# Patient Record
Sex: Female | Born: 1995 | Race: Black or African American | Hispanic: No | Marital: Single | Smoking: Never smoker
Health system: Southern US, Community
[De-identification: ages and names within clinical notes are randomized; demographics above are authoritative.]

## PROBLEM LIST (undated history)

## (undated) ENCOUNTER — Inpatient Hospital Stay (HOSPITAL_COMMUNITY): Payer: Self-pay

## (undated) DIAGNOSIS — Z789 Other specified health status: Secondary | ICD-10-CM

## (undated) HISTORY — PX: NO PAST SURGERIES: SHX2092

---
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
## 2015-05-29 ENCOUNTER — Encounter (HOSPITAL_COMMUNITY): Payer: Self-pay | Admitting: *Deleted

## 2015-05-29 ENCOUNTER — Emergency Department (HOSPITAL_COMMUNITY)
Admission: EM | Admit: 2015-05-29 | Discharge: 2015-05-29 | Disposition: A | Discharge status: Home / Self Care | Payer: Medicaid Other | Attending: Emergency Medicine | Admitting: Emergency Medicine

## 2015-05-29 DIAGNOSIS — K219 Gastro-esophageal reflux disease without esophagitis: Secondary | ICD-10-CM | POA: Insufficient documentation

## 2015-05-29 DIAGNOSIS — Z79899 Other long term (current) drug therapy: Secondary | ICD-10-CM | POA: Insufficient documentation

## 2015-05-29 DIAGNOSIS — Z3202 Encounter for pregnancy test, result negative: Secondary | ICD-10-CM | POA: Insufficient documentation

## 2015-05-29 LAB — COMPREHENSIVE METABOLIC PANEL
ALBUMIN: 3.6 g/dL (ref 3.5–5.0)
ALK PHOS: 57 U/L (ref 38–126)
ALT: 22 U/L (ref 14–54)
AST: 22 U/L (ref 15–41)
Anion gap: 7 (ref 5–15)
BUN: 8 mg/dL (ref 6–20)
CALCIUM: 9 mg/dL (ref 8.9–10.3)
CHLORIDE: 106 mmol/L (ref 101–111)
CO2: 26 mmol/L (ref 22–32)
CREATININE: 0.7 mg/dL (ref 0.44–1.00)
GFR calc Af Amer: >60 mL/min (ref >60)
GFR calc non Af Amer: >60 mL/min (ref >60)
GLUCOSE: 90 mg/dL (ref 65–99)
Potassium: 3.8 mmol/L (ref 3.5–5.1)
SODIUM: 139 mmol/L (ref 135–145)
Total Bilirubin: 0.3 mg/dL (ref 0.3–1.2)
Total Protein: 7.1 g/dL (ref 6.5–8.1)

## 2015-05-29 LAB — URINALYSIS, ROUTINE W REFLEX MICROSCOPIC
Glucose, UA: NEGATIVE mg/dL
Hgb urine dipstick: NEGATIVE
Ketones, ur: NEGATIVE mg/dL
NITRITE: NEGATIVE
PH: 5.5 (ref 5.0–8.0)
Protein, ur: NEGATIVE mg/dL
SPECIFIC GRAVITY, URINE: 1.029 (ref 1.005–1.030)
Urobilinogen, UA: 1 mg/dL (ref 0.0–1.0)

## 2015-05-29 LAB — I-STAT BETA HCG BLOOD, ED (MC, WL, AP ONLY)

## 2015-05-29 LAB — CBC
HCT: 36.3 % (ref 36.0–46.0)
HEMOGLOBIN: 12.3 g/dL (ref 12.0–15.0)
MCH: 28.9 pg (ref 26.0–34.0)
MCHC: 33.9 g/dL (ref 30.0–36.0)
MCV: 85.4 fL (ref 78.0–100.0)
Platelets: 302 10*3/uL (ref 150–400)
RBC: 4.25 MIL/uL (ref 3.87–5.11)
RDW: 12.8 % (ref 11.5–15.5)
WBC: 6.5 10*3/uL (ref 4.0–10.5)

## 2015-05-29 LAB — URINE MICROSCOPIC-ADD ON

## 2015-05-29 MED ORDER — FAMOTIDINE 20 MG PO TABS: 20.0000 mg | ORAL_TABLET | Freq: Two times a day (BID) | ORAL | Status: DC

## 2015-05-29 MED ORDER — GI COCKTAIL ~~LOC~~
30.0000 mL | Freq: Once | ORAL | Status: DC
  Filled 2015-05-29: qty 30

## 2015-05-29 MED ORDER — FAMOTIDINE 20 MG PO TABS
40.0000 mg | ORAL_TABLET | Freq: Once | ORAL | Status: AC
  Administered 2015-05-29: 40 mg via ORAL
  Filled 2015-05-29: qty 2

## 2015-05-29 NOTE — ED Notes (Signed)
Patient reports mid-upper abdominal pain that radiates into chest since yesterday, no associated symptoms. Described as cramping. States pain will go away for a few minutes then comes back.

## 2015-05-29 NOTE — ED Notes (Signed)
Pt remains monitored by blood pressure and pulse ox. Pts family remains at bedside.  

## 2015-05-29 NOTE — ED Provider Notes (Signed)
CSN: [id number]     Arrival date & time 05/29/15  1327 History   First MD Initiated Contact with Patient 05/29/15 1709     Chief Complaint  Patient presents with  . Abdominal Pain     (Consider location/radiation/quality/duration/timing/severity/associated sxs/prior Treatment) HPI   Heather Paul is a 19 y.o. female, pt with no pertinent past medical history, complains of sharp/cramping pain that is intermittent in the epigastric region beginning yesterday. Rates it 10/10 when it comes on, moves upward to chest and throat. Pt has not tried anything for the pain. Denies N/V/C/D, fever/chills, changes with eating, diaphoresis, or any other complaints. Pt denies urinary symptoms. No increase in pain upon tightening abdominal muscles. Pain comes on more frequently and is worse when she is laying down.   History reviewed. No pertinent past medical history. Past Surgical History  Procedure Laterality Date  . No past surgeries     Family History  Problem Relation Age of Onset  . Hypertension Mother   . Diabetes Mother     boderline  . Stroke Maternal Grandmother    Social History  Substance Use Topics  . Smoking status: Never Smoker   . Smokeless tobacco: Never Used  . Alcohol Use: No   OB History    Gravida Para Term Preterm AB TAB SAB Ectopic Multiple Living       Review of Systems  Constitutional: Negative for fever, chills, diaphoresis and unexpected weight change.  Respiratory: Negative for cough, chest tightness and shortness of breath.   Cardiovascular: Negative for chest pain, palpitations and leg swelling.  Gastrointestinal: Positive for abdominal pain. Negative for nausea, vomiting, diarrhea, constipation and blood in stool.  Genitourinary: Negative for dysuria, urgency, frequency, flank pain, decreased urine volume, vaginal bleeding, vaginal discharge and pelvic pain.  Musculoskeletal: Negative for back pain.  Skin: Negative for color change and  pallor.  Neurological: Negative for dizziness, syncope, weakness, light-headedness and headaches.  Hematological: Negative for adenopathy.  All other systems reviewed and are negative.     Allergies  Review of patient's allergies indicates no known allergies.  Home Medications   Prior to Admission medications   Medication Sig Start Date End Date Taking? Authorizing Provider  famotidine (PEPCID) 20 MG tablet Take 1 tablet (20 mg total) by mouth 2 (two) times daily. 05/29/15   Shawn C Joy, PA-C  medroxyPROGESTERone (DEPO-PROVERA) 150 MG/ML injection Inject 150 mg into the muscle every 3 (three) months.    Historical Provider, MD  metroNIDAZOLE (FLAGYL) 500 MG tablet Take 1 tablet (500 mg total) by mouth 2 (two) times daily. 12/17/12   Brock Bad, MD   BP 128/63 mmHg  Pulse 76  Temp(Src) 98.1 F (36.7 C) (Oral)  Resp 18  SpO2 99% Physical Exam  Constitutional: She appears well-developed and well-nourished. No distress.  HENT:  Head: Normocephalic and atraumatic.  Eyes: Conjunctivae are normal. Pupils are equal, round, and reactive to light.  Cardiovascular: Normal rate, regular rhythm and normal heart sounds.   Pulmonary/Chest: Effort normal and breath sounds normal. No respiratory distress.  Abdominal: Soft. Bowel sounds are normal.  Pt verbalizes tenderness with palpation of epigastric region, but no other reaction or guarding.   Musculoskeletal: She exhibits no edema or tenderness.  Neurological: She is alert.  Skin: Skin is warm and dry. She is not diaphoretic.  Nursing note and vitals reviewed.   ED Course  Procedures (including critical care time) Labs Review Labs  Reviewed  COMPREHENSIVE METABOLIC PANEL  CBC  URINALYSIS, ROUTINE W REFLEX MICROSCOPIC (NOT AT [facility])  I-STAT BETA HCG BLOOD, ED ([facility], [facility], [facility] ONLY)    Imaging Review No results found. I have personally reviewed and evaluated these images and lab results as part of my medical decision-making.   EKG  Interpretation None      MDM   Final diagnoses:  Gastroesophageal reflux disease, esophagitis presence not specified   Heather MillardKeisha Paul presents with epigastric pain that is worse with lying down since yesterday.  Findings and plan of care discussed with Nelva Nayobert Beaton, MD  Suspect GERD. Pt to receive GI cocktail and Pepcid. No abnormalities on labs. Pt to be discharged with prescription for Pepcid for 10-14 days and instructions to follow up with PCP. Pt updated with plan of care and pt agreed to plan.     Anselm PancoastShawn C Joy, PA-C 05/29/15 1740  Nelva Nayobert Beaton, MD 05/29/15 [phone]

## 2015-05-29 NOTE — ED Notes (Signed)
Patient brought back to room; patient undressed, in gown, on continuous pulse oximetry and blood pressure cuff 

## 2015-05-29 NOTE — Discharge Instructions (Signed)
You have been seen today for suspected gastric reflux. Your lab tests showed no abnormalities. Follow up with PCP as needed. Return to ED should symptoms worsen.  Take the prescribed medication every day for the next 10-14 days.    Emergency Department Resource Guide 1) Find a Doctor and Pay Out of Pocket Although you won't have to find out who is covered by your insurance plan, it is a good idea to ask around and get recommendations. You will then need to call the office and see if the doctor you have chosen will accept you as a new patient and what types of options they offer for patients who are self-pay. Some doctors offer discounts or will set up payment plans for their patients who do not have insurance, but you will need to ask so you aren't surprised when you get to your appointment.  2) Contact Your Local Health Department Not all health departments have doctors that can see patients for sick visits, but many do, so it is worth a call to see if yours does. If you don't know where your local health department is, you can check in your phone book. The CDC also has a tool to help you locate your state's health department, and many state websites also have listings of all of their local health departments.  3) Find a Walk-in Clinic If your illness is not likely to be very severe or complicated, you may want to try a walk in clinic. These are popping up all over the country in pharmacies, drugstores, and shopping centers. They're usually staffed by nurse practitioners or physician assistants that have been trained to treat common illnesses and complaints. They're usually fairly quick and inexpensive. However, if you have serious medical issues or chronic medical problems, these are probably not your best option.  No Primary Care Doctor: - Call Health Connect at  [phone] - they can help you locate a primary care doctor that  accepts your insurance, provides certain services, etc. - Physician  Referral Service- [phone]  Chronic Pain Problems: Organization         Address  Phone   Notes  [facility]  [phone] Patients need to be referred by their primary care doctor.   Medication Assistance: Organization         Address  Phone   Notes  [facility] Medication [facility] [address] [place], [place] [address] [phone] --Must be a resident of [facility] [place] -- Must have NO insurance coverage whatsoever (no Medicaid/ Medicare, etc.) -- The pt. MUST have a primary care doctor that directs their care regularly and follows them in the community   MedAssist  [phone]   [employer]  [phone]    Agencies that provide inexpensive medical care: Organization         Address  Phone   Notes  [facility]  [phone]   [facility]    [phone]   [facility] [address] [place], [place] [address] [phone]   [facility] [address], [place] [phone]   Planned Parenthood    [phone]   [facility]    [phone]   Community Health and [facility]  [address], [place] Phone:  [phone], Fax:  [phone] Hours of Operation:  9 am - 6 pm, M-F.  Also accepts Medicaid/Medicare and  self-pay.  [facility] for [place] [address], [address], [place] Phone: [phone], Fax: [phone]. Hours of Operation:  8:30 am - 5:30 pm, M-F.  Also accepts Medicaid and self-pay.  [facility] [address] [place] Phone: [phone]   [place], [place], [place] [phone], Ext. [phone] Mondays & Thursdays: 7-9 AM.  First 15 patients are seen on a first come, first serve basis.    [place] Providers:  Organization         Address  Phone   Notes  [facility] [address], [place] [phone] Also accepts self-pay patients.  [facility] [address], [place]  [phone]   [place], [address], [place] [phone]   [facility] [address] [place] [phone]   Lucianne Lei [address], [place]   [phone] Only accepts [place] Access [place] patients after they have their name applied to their card.   Self-Pay (no insurance) in [employer]:  Organization         Address  Phone   Notes  Sickle Cell Patients, [facility] [place] [phone]   [facility]gent [facility] [place] [phone]   [facility] [place]  [place], [place],  [phone]   [facility]/Dr. Osei-Bonsu  [address] [place] or [address], [place] [phone] Phone number for both [place] and [place] locations is the same.  Urgent Medical and [facility] [address] [place] [phone]   [facility] [address], [place] or [address] [phone]   [facility] [address] [place] [phone], phone; [phone], fax Sees patients 1st and 3rd Saturday of every month.  Must not qualify for public or private insurance (i.e. Medicaid, Medicare, [place] Health Choice, Veterans' Benefits)  Household income should be no more than 200% of the poverty level The clinic cannot treat you if you are pregnant or think you are pregnant  Sexually transmitted diseases are not treated at the clinic.    Dental Care: Organization         Address  Phone  Notes  [facility] Department of [facility] [phone] Accepts children up to age 40 who are enrolled  in [place] or [place]; pregnant women with a Medicaid card; and children who have applied for Medicaid or [place] Health Choice, but were declined, whose parents can pay a reduced fee at time of service.  [facility] Department of [facility]  [address], [place] [phone] Accepts children up to age 37 who are enrolled in [place] or [place]; pregnant women with a Medicaid card; and children who have applied for Medicaid or [place] Health Choice, but were declined, whose parents can pay a reduced fee at time of service.  [place] Adult Dental Access PROGRAM  [place] [phone] Patients are seen by appointment only. Walk-ins are not accepted. [place] will see patients 57 years of age and older. Monday - Tuesday (8am-5pm) Most Wednesdays (8:30-5pm) $  30 per visit, cash only  [facility] Adult [employer] PROGRAM  [address], [facility] [phone] Patients are seen by appointment only. Walk-ins are not accepted. [place] will see patients 62 years of age and older. One Wednesday Evening (Monthly: Volunteer Based).  $30 per visit, cash only  [place] Di­az  [phone] for adults; Children under age 14, call Graduate Pediatric Dentistry at [phone]. Children aged 37-14, please call [phone] to request a pediatric application.  Dental services are provided in all areas of dental care including fillings, crowns and bridges, complete and partial dentures, implants, gum treatment, root canals, and extractions. Preventive care is also provided. Treatment is provided to both adults and children. Patients are selected via a lottery and there is often a waiting list.   [facility] [address] [place]  [phone] [email or web address]   [facility] [address] [place], [place] [phone], Ext. [phone] Second and Fourth Thursday of each month, opens at  6:30 AM; Clinic ends at 9 AM.  Patients are seen on a first-come first-served basis, and a limited number are seen during each clinic.   [facility]  [address] Hillard Danker [place], [place] [phone]   Eligibility Requirements You must have lived in [place], [place], or [place] counties for at least the last three months.   You cannot be eligible for state or federal sponsored [employer], including [employer], [place], or [employer].   You generally cannot be eligible for healthcare insurance through your employer.    How to apply: Eligibility screenings are held every Tuesday and Wednesday afternoon from 1:00 pm until 4:00 pm. You do not need an appointment for the interview!  [facility] [address] [place], [place]   [place]  [place] Department  [place]  [phone]    Behavioral Health Resources in the Community: Intensive Outpatient Programs Organization         Address  Phone  Notes  [facility] [place]. [address] [place], [place] [phone]   [facility] Outpatient [address], [place], [place]   ADS: Alcohol & Drug Svcs [address] [place], [employer]   [place] [address]  [place], [place] or [phone]   Substance Abuse Resources Organization         Address  Phone  Notes  Alcohol and Drug Services  [phone]   [place]  [phone]   [facility] [place]   Chinita Pester  [phone]   Residential & Outpatient Substance Abuse Program  [phone]   Psychological Services Organization         Address  Phone  Notes  [facility]  [place]  [phone]   [place] [address] [place] or [phone]    Mobile Crisis Teams Organization         Address  Phone  Notes  [facility], Mobile Crisis Care Unit  [phone]   [facility]ive Psychotherapeutic Services  [address] [place], [place]   [place] [address] [place] [phone]    Self-Help/Support Groups Organization         Address  Phone  Notes  Mental Health Assoc. of [place] - variety of support groups  [place] Call for more information  Narcotics Anonymous (NA), [facility]aring [facility] [address], [employer] [place]  2 meetings at this location   Special educational needs teacher         Address  Phone  Notes  ASAP Residential Treatment [place],    [place]  [phone]   [facility]  [address], [place] [phone], [place], [place]   [place], [place] [phone] Admissions: 8am-3pm M-F  Incentives Substance [place] [address],    [place], [place] [phone]   [facility] [address] [place], [place], [place]   [facility] [address],  [place], [place]   Insight Programs - Intensive Outpatient [address], [place], [place]   [facility] ([place].,  [place], [place] [phone] or [phone]   [facility]idential Treatment Services (RTS) [address] [place], [place] Accepts Medicaid  [place] [address]  [place] [phone] Substance Abuse/Addiction Treatment   [facility] Organization         Address  Phone  Notes  [facility]  [phone]   Domenic Schwab, PhD [address] Arlis Porta [place], [place]   [phone] or [phone]   [place], [place] [phone]   [facility]  [address] [place], [place] [phone] Insurance/Medicaid/sponsorship through [facility] and Families [address], [address] [place]                                    [place], [place] [phone] [place] [address], [place] [phone]    Dr. Adele Schilder  [phone]   [facility] Dept. 1) [address], [place] 2) [place] 3)  [place] [address], [place] [phone]  [phone]   [place] [phone] or [phone] (After Hours)

## 2015-05-29 NOTE — ED Notes (Signed)
Patient aware of need of urine specimen; patient will attempt to provide an sample at this time

## 2015-05-31 LAB — URINE CULTURE

## 2015-07-23 ENCOUNTER — Emergency Department (HOSPITAL_COMMUNITY)
Admission: EM | Admit: 2015-07-23 | Discharge: 2015-07-23 | Disposition: A | Discharge status: Home / Self Care | Payer: Medicaid Other | Attending: Emergency Medicine | Admitting: Emergency Medicine

## 2015-07-23 ENCOUNTER — Encounter (HOSPITAL_COMMUNITY): Payer: Self-pay | Admitting: Nurse Practitioner

## 2015-07-23 DIAGNOSIS — J069 Acute upper respiratory infection, unspecified: Secondary | ICD-10-CM | POA: Diagnosis not present

## 2015-07-23 DIAGNOSIS — Z793 Long term (current) use of hormonal contraceptives: Secondary | ICD-10-CM | POA: Diagnosis not present

## 2015-07-23 DIAGNOSIS — Z792 Long term (current) use of antibiotics: Secondary | ICD-10-CM | POA: Diagnosis not present

## 2015-07-23 DIAGNOSIS — Z79899 Other long term (current) drug therapy: Secondary | ICD-10-CM | POA: Diagnosis not present

## 2015-07-23 DIAGNOSIS — R6883 Chills (without fever): Secondary | ICD-10-CM | POA: Diagnosis present

## 2015-07-23 DIAGNOSIS — B9789 Other viral agents as the cause of diseases classified elsewhere: Secondary | ICD-10-CM

## 2015-07-23 LAB — RAPID STREP SCREEN (MED CTR MEBANE ONLY): Streptococcus, Group A Screen (Direct): NEGATIVE

## 2015-07-23 MED ORDER — ACETAMINOPHEN-CODEINE 120-12 MG/5ML PO SOLN: 10.0000 mL | ORAL | Status: DC | PRN

## 2015-07-23 MED ORDER — IBUPROFEN 800 MG PO TABS: 800.0000 mg | ORAL_TABLET | Freq: Three times a day (TID) | ORAL | Status: DC | PRN

## 2015-07-23 MED ORDER — GUAIFENESIN ER 1200 MG PO TB12: 1.0000 | ORAL_TABLET | Freq: Two times a day (BID) | ORAL | Status: DC

## 2015-07-23 NOTE — ED Notes (Signed)
Declined W/C at D/C and was escorted to lobby by RN. 

## 2015-07-23 NOTE — ED Notes (Signed)
She c/o chills, body aches, headaches since last night. States it feels like when she had the flu. A&Ox4, resp e/u

## 2015-07-23 NOTE — Discharge Instructions (Signed)
Return here as needed.  Follow-up with a primary care Dr. increase her fluid intake and rest as much possible

## 2015-07-23 NOTE — ED Provider Notes (Signed)
CSN: [id number]     Arrival date & time 07/23/15  1620 History   First MD Initiated Contact with Patient 07/23/15 1637     Chief Complaint  Patient presents with  . Chills     (Consider location/radiation/quality/duration/timing/severity/associated sxs/prior Treatment) HPI Patient presents to the emergency department with chills, body aches since yesterday.  Patient states that she feels like she has the flu.  The patient denies chest pain, nausea, vomiting, weakness, dizziness, headache, blurred vision, back pain, neck pain, dysuria, incontinence, abdominal pain, or syncope.  The patient states that she did not take any medicines other than Alka-Seltzer cough and cold with no relief.  The patient states that nothing seems make her condition better or worse History reviewed. No pertinent past medical history. Past Surgical History  Procedure Laterality Date  . No past surgeries     Family History  Problem Relation Age of Onset  . Hypertension Mother   . Diabetes Mother     boderline  . Stroke Maternal Grandmother    Social History  Substance Use Topics  . Smoking status: Never Smoker   . Smokeless tobacco: Never Used  . Alcohol Use: No   OB History    Gravida Para Term Preterm AB TAB SAB Ectopic Multiple Living       Review of Systems  All other systems negative except as documented in the HPI. All pertinent positives and negatives as reviewed in the HPI.   Allergies  Review of patient's allergies indicates no known allergies.  Home Medications   Prior to Admission medications   Medication Sig Start Date End Date Taking? Authorizing Provider  famotidine (PEPCID) 20 MG tablet Take 1 tablet (20 mg total) by mouth 2 (two) times daily. 05/29/15   Shawn C Joy, PA-C  medroxyPROGESTERone (DEPO-PROVERA) 150 MG/ML injection Inject 150 mg into the muscle every 3 (three) months.    Historical Provider, MD  metroNIDAZOLE (FLAGYL) 500 MG tablet Take 1 tablet  (500 mg total) by mouth 2 (two) times daily. 12/17/12   Brock Bad, MD   BP 105/79 mmHg  Pulse 105  Temp(Src) 99.7 F (37.6 C) (Oral)  Resp 20  Ht  (1.702 m)  Wt 107.684 kg  BMI 37.17 kg/m2  SpO2 99%  LMP 07/22/2015 Physical Exam  Constitutional: She is oriented to person, place, and time. She appears well-developed and well-nourished. No distress.  HENT:  Head: Normocephalic and atraumatic.  Mouth/Throat: Oropharynx is clear and moist.  Eyes: Pupils are equal, round, and reactive to light.  Neck: Normal range of motion. Neck supple.  Cardiovascular: Normal rate, regular rhythm and normal heart sounds.  Exam reveals no gallop and no friction rub.   No murmur heard. Pulmonary/Chest: Effort normal and breath sounds normal. No respiratory distress. She has no wheezes.  Abdominal: Soft. Bowel sounds are normal. She exhibits no distension. There is no tenderness.  Neurological: She is alert and oriented to person, place, and time. She exhibits normal muscle tone. Coordination normal.  Skin: Skin is warm and dry. No rash noted. No erythema.  Psychiatric: She has a normal mood and affect. Her behavior is normal.  Nursing note and vitals reviewed.   ED Course  Procedures (including critical care time) Labs Review Labs Reviewed  RAPID STREP SCREEN (NOT AT [facility])  CULTURE, GROUP A STREP    Imaging Review No results found. I have personally reviewed and evaluated these images and lab  results as part of my medical decision-making. Patient be treated for influenza-like illness.  Told return here as needed.  Patient agrees the plan and all questions were answered.  Told to increase her fluid intake and rest as much as possible   Charlestine NightChristopher Maaz Spiering, PA-C 07/23/15 2322  Gerhard Munchobert Lockwood, MD 07/23/15 [phone]

## 2015-07-25 LAB — CULTURE, GROUP A STREP: Strep A Culture: NEGATIVE

## 2016-02-21 ENCOUNTER — Emergency Department (HOSPITAL_COMMUNITY)
Admission: EM | Admit: 2016-02-21 | Discharge: 2016-02-21 | Disposition: A | Discharge status: Home / Self Care | Payer: Medicaid Other | Attending: Emergency Medicine | Admitting: Emergency Medicine

## 2016-02-21 ENCOUNTER — Emergency Department (HOSPITAL_BASED_OUTPATIENT_CLINIC_OR_DEPARTMENT_OTHER): Admit: 2016-02-21 | Discharge: 2016-02-21 | Disposition: A | Discharge status: Home / Self Care | Payer: Medicaid Other

## 2016-02-21 ENCOUNTER — Encounter (HOSPITAL_COMMUNITY): Payer: Self-pay | Admitting: Family Medicine

## 2016-02-21 DIAGNOSIS — M7989 Other specified soft tissue disorders: Secondary | ICD-10-CM

## 2016-02-21 DIAGNOSIS — M79605 Pain in left leg: Secondary | ICD-10-CM

## 2016-02-21 LAB — POC URINE PREG, ED: PREG TEST UR: NEGATIVE

## 2016-02-21 NOTE — ED Notes (Signed)
Patient requesting to leave, Left room and went to [facility]. States she can't wait any longer. Vascular lab paged. Taking patient now.

## 2016-02-21 NOTE — ED Notes (Signed)
Pt here for LLE swelling and pain. sts for a while. Denies injury

## 2016-02-21 NOTE — ED Provider Notes (Signed)
CSN: [id number]     Arrival date & time 02/21/16  1243 History   First MD Initiated Contact with Patient 02/21/16 1413     Chief Complaint  Patient presents with  . Leg Swelling     (Consider location/radiation/quality/duration/timing/severity/associated sxs/prior Treatment) HPI   Pt presents with left leg swelling and pain that has been going on for "a few days."  States others noted that her left leg appeared swollen.  She has noticed it has been aching.  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   Denies any injury to the leg or recent falls/trauma.  Denies CP, SOB, fevers, weakness or numbness of the leg.  Denies recent immobilization, exogenous estrogen, personal or family history of blood clots.     History reviewed. No pertinent past medical history. Past Surgical History  Procedure Laterality Date  . No past surgeries     Family History  Problem Relation Age of Onset  . Hypertension Mother   . Diabetes Mother     boderline  . Stroke Maternal Grandmother    Social History  Substance Use Topics  . Smoking status: Never Smoker   . Smokeless tobacco: Never Used  . Alcohol Use: No   OB History    Gravida Para Term Preterm AB TAB SAB Ectopic Multiple Living   0 0 0 0 0 0 0 0 0 0      Review of Systems  Constitutional: Negative for fever and chills.  Respiratory: Negative for cough and shortness of breath.   Cardiovascular: Positive for leg swelling. Negative for chest pain.  Musculoskeletal: Negative for arthralgias.  Skin: Negative for color change.  Allergic/Immunologic: Negative for immunocompromised state.  Neurological: Negative for weakness and numbness.  Hematological: Does not bruise/bleed easily.  Psychiatric/Behavioral: Negative for self-injury.      Allergies  Review of patient's allergies indicates no known allergies.  Home Medications   Prior to Admission medications   Medication Sig Start Date End Date Taking? Authorizing  Provider  acetaminophen-codeine 120-12 MG/5ML solution Take 10 mLs by mouth every 4 (four) hours as needed for moderate pain. Patient not taking: Reported on 02/21/2016 07/23/15   Charlestine Nighthristopher Lawyer, PA-C  famotidine (PEPCID) 20 MG tablet Take 1 tablet (20 mg total) by mouth 2 (two) times daily. Patient not taking: Reported on 02/21/2016 05/29/15   Shawn C Joy, PA-C  Guaifenesin 1200 MG TB12 Take 1 tablet (1,200 mg total) by mouth 2 (two) times daily. Patient not taking: Reported on 02/21/2016 07/23/15   Charlestine Nighthristopher Lawyer, PA-C  ibuprofen (ADVIL,MOTRIN) 800 MG tablet Take 1 tablet (800 mg total) by mouth every 8 (eight) hours as needed. Patient not taking: Reported on 02/21/2016 07/23/15   Charlestine Nighthristopher Lawyer, PA-C  metroNIDAZOLE (FLAGYL) 500 MG tablet Take 1 tablet (500 mg total) by mouth 2 (two) times daily. Patient not taking: Reported on 02/21/2016 12/17/12   Brock Badharles A Harper, MD   BP 119/86 mmHg  Pulse 82  Temp(Src) 98.3 F (36.8 C)  Resp 18  SpO2 100%  LMP 02/21/2016 Physical Exam  Constitutional: She appears well-developed and well-nourished. No distress.  HENT:  Head: Normocephalic and atraumatic.  Neck: Neck supple.  Cardiovascular: Normal rate and regular rhythm.   Pulmonary/Chest: Effort normal and breath sounds normal. No respiratory distress. She has no wheezes. She has no rales.  Musculoskeletal: Normal range of motion.  Left leg may be slightly larger than right.  Pt reports improvement of "aching" in left calf with palpation.  Full AROM of all joints, no erythema, warmth.  Pulses and sensation intact.    Neurological: She is alert.  Skin: She is not diaphoretic.  Nursing note and vitals reviewed.   ED Course  Procedures (including critical care time) Labs Review Labs Reviewed - No data to display  Imaging Review No results found. I have personally reviewed and evaluated these images and lab results as part of my medical decision-making.   EKG Interpretation None       MDM   Final diagnoses:  Left leg pain    Afebrile, nontoxic patient with atraumatic swelling and pain in the left leg.  No known risk factors for DVT.  NO trauma.  Neurovascularly intact.   DVT study is negative.   D/C home but patient left the ED immediately upon return from the vascular lab without waiting for results or paperwork.     Trixie Dredge, PA-C 02/21/16 1700  Mancel Bale, MD 02/22/16 1539

## 2016-02-21 NOTE — Progress Notes (Signed)
*  PRELIMINARY RESULTS* Vascular Ultrasound Left lower extremity venous duplex has been completed.  Preliminary findings: No evidence of DVT or baker's cyst.   Farrel DemarkJill Eunice, RDMS, RVT  02/21/2016, 4:48 PM

## 2016-02-21 NOTE — ED Notes (Signed)
Patient left from vascular lab before transport could bring her back and went to [facility]. Stated that she couldn't wait any longer. Patient not there when I went to give her papers. EDP notified.

## 2016-02-21 NOTE — ED Notes (Signed)
Patient standing here in front of me at [facility] stating "hey noone has come to see me and they taking too long. So will I get in trouble if I just leave?"; asked patient her name and called her nurse Christena DeemBrooke B, RN; informed Christena DeemBrooke B, RN that her patient was here at [facility]

## 2016-02-21 NOTE — Discharge Instructions (Signed)
Read the information below.  You may return to the Emergency Department at any time for worsening condition or any new symptoms that concern you.  You may take tylenol or motrin for your pain.  Please elevate your legs daily after work.  If you develop uncontrolled pain, weakness or numbness of the extremity, severe discoloration of the skin, or you are unable to walk or move your legs, return to the ER for a recheck.     You do not have a blood clot in your leg and your pregnancy test is negative.     Musculoskeletal Pain Musculoskeletal pain is muscle and boney aches and pains. These pains can occur in any part of the body. Your caregiver may treat you without knowing the cause of the pain. They may treat you if blood or urine tests, X-rays, and other tests were normal.  CAUSES There is often not a definite cause or reason for these pains. These pains may be caused by a type of germ (virus). The discomfort may also come from overuse. Overuse includes working out too hard when your body is not fit. Boney aches also come from weather changes. Bone is sensitive to atmospheric pressure changes. HOME CARE INSTRUCTIONS   Ask when your test results will be ready. Make sure you get your test results.  Only take over-the-counter or prescription medicines for pain, discomfort, or fever as directed by your caregiver. If you were given medications for your condition, do not drive, operate machinery or power tools, or sign legal documents for 24 hours. Do not drink alcohol. Do not take sleeping pills or other medications that may interfere with treatment.  Continue all activities unless the activities cause more pain. When the pain lessens, slowly resume normal activities. Gradually increase the intensity and duration of the activities or exercise.  During periods of severe pain, bed rest may be helpful. Lay or sit in any position that is comfortable.  Putting ice on the injured area.  Put ice in a  bag.  Place a towel between your skin and the bag.  Leave the ice on for 15 to 20 minutes, 3 to 4 times a day.  Follow up with your caregiver for continued problems and no reason can be found for the pain. If the pain becomes worse or does not go away, it may be necessary to repeat tests or do additional testing. Your caregiver may need to look further for a possible cause. SEEK IMMEDIATE MEDICAL CARE IF:  You have pain that is getting worse and is not relieved by medications.  You develop chest pain that is associated with shortness or breath, sweating, feeling sick to your stomach (nauseous), or throw up (vomit).  Your pain becomes localized to the abdomen.  You develop any new symptoms that seem different or that concern you. MAKE SURE YOU:   Understand these instructions.  Will watch your condition.  Will get help right away if you are not doing well or get worse.   This information is not intended to replace advice given to you by your health care provider. Make sure you discuss any questions you have with your health care provider.   Document Released: 07/22/2005 Document Revised: 10/14/2011 Document Reviewed: 03/26/2013 Elsevier Interactive Patient Education [employer]2016 [employer] Inc.

## 2016-05-25 ENCOUNTER — Encounter (HOSPITAL_COMMUNITY): Payer: Self-pay | Admitting: *Deleted

## 2016-05-25 ENCOUNTER — Emergency Department (HOSPITAL_COMMUNITY)
Admission: EM | Admit: 2016-05-25 | Discharge: 2016-05-25 | Disposition: A | Discharge status: Home / Self Care | Payer: Medicaid Other | Attending: Emergency Medicine | Admitting: Emergency Medicine

## 2016-05-25 DIAGNOSIS — B9689 Other specified bacterial agents as the cause of diseases classified elsewhere: Secondary | ICD-10-CM

## 2016-05-25 DIAGNOSIS — R1032 Left lower quadrant pain: Secondary | ICD-10-CM | POA: Diagnosis present

## 2016-05-25 DIAGNOSIS — N760 Acute vaginitis: Secondary | ICD-10-CM | POA: Insufficient documentation

## 2016-05-25 LAB — I-STAT BETA HCG BLOOD, ED (MC, WL, AP ONLY): I-stat hCG, quantitative: <5 m[IU]/mL (ref <5)

## 2016-05-25 LAB — URINALYSIS, ROUTINE W REFLEX MICROSCOPIC
BILIRUBIN URINE: NEGATIVE
Glucose, UA: NEGATIVE mg/dL
HGB URINE DIPSTICK: NEGATIVE
Ketones, ur: NEGATIVE mg/dL
Leukocytes, UA: NEGATIVE
NITRITE: NEGATIVE
PH: 7.5 (ref 5.0–8.0)
Protein, ur: NEGATIVE mg/dL
SPECIFIC GRAVITY, URINE: 1.025 (ref 1.005–1.030)

## 2016-05-25 LAB — COMPREHENSIVE METABOLIC PANEL
ALBUMIN: 3.7 g/dL (ref 3.5–5.0)
ALT: 15 U/L (ref 14–54)
ANION GAP: 7 (ref 5–15)
AST: 18 U/L (ref 15–41)
Alkaline Phosphatase: 62 U/L (ref 38–126)
BUN: 9 mg/dL (ref 6–20)
CHLORIDE: 105 mmol/L (ref 101–111)
CO2: 23 mmol/L (ref 22–32)
Calcium: 8.7 mg/dL — ABNORMAL LOW (ref 8.9–10.3)
Creatinine, Ser: 0.77 mg/dL (ref 0.44–1.00)
GFR calc Af Amer: >60 mL/min (ref >60)
GFR calc non Af Amer: >60 mL/min (ref >60)
GLUCOSE: 93 mg/dL (ref 65–99)
POTASSIUM: 3.7 mmol/L (ref 3.5–5.1)
SODIUM: 135 mmol/L (ref 135–145)
Total Bilirubin: 0.3 mg/dL (ref 0.3–1.2)
Total Protein: 6.9 g/dL (ref 6.5–8.1)

## 2016-05-25 LAB — CBC
HEMATOCRIT: 35.8 % — AB (ref 36.0–46.0)
HEMOGLOBIN: 12.2 g/dL (ref 12.0–15.0)
MCH: 28 pg (ref 26.0–34.0)
MCHC: 34.1 g/dL (ref 30.0–36.0)
MCV: 82.1 fL (ref 78.0–100.0)
Platelets: 355 10*3/uL (ref 150–400)
RBC: 4.36 MIL/uL (ref 3.87–5.11)
RDW: 13.4 % (ref 11.5–15.5)
WBC: 8.2 10*3/uL (ref 4.0–10.5)

## 2016-05-25 LAB — WET PREP, GENITAL
SPERM: NONE SEEN
Trich, Wet Prep: NONE SEEN
Yeast Wet Prep HPF POC: NONE SEEN

## 2016-05-25 MED ORDER — METRONIDAZOLE 500 MG PO TABS: 500.0000 mg | ORAL_TABLET | Freq: Two times a day (BID) | ORAL | 0 refills | Status: DC

## 2016-05-25 NOTE — ED Notes (Signed)
Patient verbalized understanding of discharge instructions and denies any further needs or questions at this time. VS stable. Patient ambulatory with steady gait, declined wheelchair. Escorted to ED entrance.  

## 2016-05-25 NOTE — Discharge Instructions (Signed)
Please read and follow all provided instructions.  Your diagnoses today include:  1. BV (bacterial vaginosis)     Tests performed today include: Blood counts and electrolytes Blood tests to check liver and kidney function Blood tests to check pancreas function Urine test to look for infection and pregnancy (in women) Vital signs. See below for your results today.   Medications prescribed:   Take any prescribed medications only as directed.  Home care instructions:  Follow any educational materials contained in this packet.  Follow-up instructions: Please follow-up with your primary care provider in the next 2 days for further evaluation of your symptoms.    Return instructions:  SEEK IMMEDIATE MEDICAL ATTENTION IF: The pain does not go away or becomes severe  A temperature above 101F develops  Repeated vomiting occurs (multiple episodes)  The pain becomes localized to portions of the abdomen. The right side could possibly be appendicitis. In an adult, the left lower portion of the abdomen could be colitis or diverticulitis.  Blood is being passed in stools or vomit (bright red or black tarry stools)  You develop chest pain, difficulty breathing, dizziness or fainting, or become confused, poorly responsive, or inconsolable (young children) If you have any other emergent concerns regarding your health  Additional Information: Abdominal (belly) pain can be caused by many things. Your caregiver performed an examination and possibly ordered blood/urine tests and imaging (CT scan, x-rays, ultrasound). Many cases can be observed and treated at home after initial evaluation in the emergency department. Even though you are being discharged home, abdominal pain can be unpredictable. Therefore, you need a repeated exam if your pain does not resolve, returns, or worsens. Most patients with abdominal pain don't have to be admitted to the hospital or have surgery, but serious problems like  appendicitis and gallbladder attacks can start out as nonspecific pain. Many abdominal conditions cannot be diagnosed in one visit, so follow-up evaluations are very important.  Your vital signs today were: BP 124/75 (BP Location: Right Arm)    Pulse 86    Temp 99.3 F (37.4 C) (Oral)    Resp 18    Ht 5\' 7"  (1.702 m)    LMP 04/21/2016    SpO2 98%  If your blood pressure (bp) was elevated above 135/85 this visit, please have this repeated by your doctor within one month. --------------

## 2016-05-25 NOTE — ED Provider Notes (Signed)
[facility]-EMERGENCY DEPT Provider Note   CSN: [id number] Arrival date & time: 05/25/16  1404  History   Chief Complaint Chief Complaint  Patient presents with  . Abdominal Pain  . Possible Pregnancy    HPI Heather Paul is a 20 y.o. female.  HPI  20 y.o. female, presents to the Emergency Department today complaining of bilateral lower abdominal pain x 4-5 days. Notes pain feels like a cramping sensation that is intermittent. Notes pain is 6/10. Does not endorse pain currently. Notes nausea. No V/D. No fevers. No CP/SOB. No vaginal bleeding. Notes occurrence of some discharge last week that was clear in color, but none now. Pt is sexually active with one partner and does not use any contraception. Notes LMP last September on the 17th. States she is usually regular on her cycle. Here for pregnancy test. No other symptoms noted.    History reviewed. No pertinent past medical history.  There are no active problems to display for this patient.   Past Surgical History:  Procedure Laterality Date  . NO PAST SURGERIES      OB History    Gravida Para Term Preterm AB Living   0 0 0 0 0 0   SAB TAB Ectopic Multiple Live Births   0 0 0 0         Home Medications    Prior to Admission medications   Medication Sig Start Date End Date Taking? Authorizing Provider  acetaminophen-codeine 120-12 MG/5ML solution Take 10 mLs by mouth every 4 (four) hours as needed for moderate pain. Patient not taking: Reported on 02/21/2016 07/23/15   Charlestine Nighthristopher Lawyer, PA-C  famotidine (PEPCID) 20 MG tablet Take 1 tablet (20 mg total) by mouth 2 (two) times daily. Patient not taking: Reported on 02/21/2016 05/29/15   Shawn C Joy, PA-C  Guaifenesin 1200 MG TB12 Take 1 tablet (1,200 mg total) by mouth 2 (two) times daily. Patient not taking: Reported on 02/21/2016 07/23/15   Charlestine Nighthristopher Lawyer, PA-C  ibuprofen (ADVIL,MOTRIN) 800 MG tablet Take 1 tablet (800 mg total) by mouth every 8 (eight) hours as  needed. Patient not taking: Reported on 02/21/2016 07/23/15   Charlestine Nighthristopher Lawyer, PA-C  metroNIDAZOLE (FLAGYL) 500 MG tablet Take 1 tablet (500 mg total) by mouth 2 (two) times daily. Patient not taking: Reported on 02/21/2016 12/17/12   Brock Badharles A Harper, MD    Family History Family History  Problem Relation Age of Onset  . Hypertension Mother   . Diabetes Mother     boderline  . Stroke Maternal Grandmother     Social History Social History  Substance Use Topics  . Smoking status: Never Smoker  . Smokeless tobacco: Never Used  . Alcohol use No     Allergies   Review of patient's allergies indicates no known allergies.   Review of Systems Review of Systems ROS reviewed and all are negative for acute change except as noted in the HPI.  Physical Exam Updated Vital Signs BP 124/75 (BP Location: Right Arm)   Pulse 86   Temp 99.3 F (37.4 C) (Oral)   Resp 18   Ht 5\' 7"  (1.702 m)   LMP 04/21/2016   SpO2 98%   Physical Exam  Constitutional: She is oriented to person, place, and time. Vital signs are normal. She appears well-developed and well-nourished.  NAD. Resting comfortably.  HENT:  Head: Normocephalic and atraumatic.  Right Ear: Hearing normal.  Left Ear: Hearing normal.  Eyes: Conjunctivae and EOM are normal. Pupils are equal,  round, and reactive to light.  Neck: Normal range of motion. Neck supple.  Cardiovascular: Normal rate, regular rhythm, normal heart sounds and intact distal pulses.   Pulmonary/Chest: Effort normal and breath sounds normal.  Abdominal: Soft. Normal appearance. There is no tenderness. There is no rigidity, no rebound, no guarding, no CVA tenderness, no tenderness at McBurney's point and negative Murphy's sign.  Musculoskeletal: Normal range of motion.  Neurological: She is alert and oriented to person, place, and time.  Skin: Skin is warm and dry.  Psychiatric: She has a normal mood and affect. Her speech is normal and behavior is normal.  Thought content normal.  Nursing note and vitals reviewed.  Exam performed by Eston Esters,  exam chaperoned Date: 05/25/2016 Pelvic exam: normal external genitalia without evidence of trauma. VULVA: normal appearing vulva with no masses, tenderness or lesion. VAGINA: normal appearing vagina with normal color and discharge, no lesions. CERVIX: normal appearing cervix without lesions, cervical motion tenderness absent, cervical os closed with out purulent discharge; vaginal discharge - clear and malodorous, Wet prep and DNA probe for chlamydia and GC obtained.   ADNEXA: normal adnexa in size, nontender and no masses UTERUS: uterus is normal size, shape, consistency and nontender.   ED Treatments / Results  Labs (all labs ordered are listed, but only abnormal results are displayed) Labs Reviewed  WET PREP, GENITAL - Abnormal; Notable for the following:       Result Value   Clue Cells Wet Prep HPF POC PRESENT (*)    WBC, Wet Prep HPF POC MODERATE (*)    All other components within normal limits  COMPREHENSIVE METABOLIC PANEL - Abnormal; Notable for the following:    Calcium 8.7 (*)    All other components within normal limits  CBC - Abnormal; Notable for the following:    HCT 35.8 (*)    All other components within normal limits  URINALYSIS, ROUTINE W REFLEX MICROSCOPIC (NOT AT [facility]) - Abnormal; Notable for the following:    APPearance CLOUDY (*)    All other components within normal limits  I-STAT BETA HCG BLOOD, ED ([facility], [facility], [facility] ONLY)  GC/CHLAMYDIA PROBE AMP ([place]) NOT AT [facility]   EKG  EKG Interpretation None       Radiology No results found.  Procedures Procedures (including critical care time)  Medications Ordered in ED Medications - No data to display   Initial Impression / Assessment and Plan / ED Course  I have reviewed the triage vital signs and the nursing notes.  Pertinent labs & imaging results that were available during my care of the patient were  reviewed by me and considered in my medical decision making (see chart for details).  Clinical Course    Final Clinical Impressions(s) / ED Diagnoses  I have reviewed and evaluated the relevant laboratory values I have reviewed the relevant previous healthcare records. I obtained HPI from historian.  ED Course:  Assessment: Patient is a 20yF presents with abdominal pain x 4-5 days. Bilateral lower quadrants. Intermittent. Concern for pregnancy due to late menstrual cycle. On exam, nontoxic, nonseptic appearing, in no apparent distress. Patient's pain and other symptoms adequately managed in emergency department. Labs, and vitals reviewed. CBC unremarkable. UA unremarkable. GC obtained. Wet prep shows Bacterial vaginosis.  Patient does not meet the SIRS or Sepsis criteria.  On repeat exam patient does not have a surgical abdomen and there are no peritoneal signs.  No indication of appendicitis, bowel obstruction, bowel perforation, cholecystitis, diverticulitis,PID  or ectopic pregnancy. Pelvic exam without CMT or Adnexal tenderness. Odor noted. Will treat for BV with Rx Flagyl. Patient discharged home with symptomatic treatment and given strict instructions for follow-up with their primary care physician.  I have also discussed reasons to return immediately to the ER.  Patient expresses understanding and agrees with plan.  Disposition/Plan:  DC Home Additional Verbal discharge instructions given and discussed with patient.  Pt Instructed to f/u with PCP in the next week for evaluation and treatment of symptoms. Return precautions given Pt acknowledges and agrees with plan  Supervising Physician Loren Racer, MD   Final diagnoses:  BV (bacterial vaginosis)    New Prescriptions New Prescriptions   No medications on file     Audry Pili, PA-C 05/25/16 1651    Loren Racer, MD 05/31/16 [id number]

## 2016-05-25 NOTE — ED Notes (Signed)
Pelvic cart set up at bedside  

## 2016-05-25 NOTE — ED Triage Notes (Addendum)
Pt reports right side lower abd/groin pains for several days. Pt reports menstrual being late x 4-5 days and has taken home preg test which are negative.reprots mild nausea, denies vomiting diarrhea or vaginal symptoms. No acute distress noted at triage.

## 2016-05-27 LAB — GC/CHLAMYDIA PROBE AMP (~~LOC~~) NOT AT ARMC
CHLAMYDIA, DNA PROBE: NEGATIVE
Neisseria Gonorrhea: NEGATIVE

## 2016-08-05 NOTE — L&D Delivery Note (Signed)
Patient was C/C/-1 and pushed for approx 1hr and 35minutes with epidural.   NSVD female infant, Apgars pending, weight pending.   The patient had Left vaginal sulcus tear repaired with 2-0 civryl and Left periurethral tear repaired with 3-0 vicryl Fundus was firm. EBL was expected amount. Placenta was delivered intact. Vagina was clear.  Baby was vigorous and doing skin to skin with mother.  Philip AspenALLAHAN, Bryssa Tones

## 2016-08-13 ENCOUNTER — Encounter (HOSPITAL_COMMUNITY): Payer: Self-pay | Admitting: Emergency Medicine

## 2016-08-13 ENCOUNTER — Emergency Department (HOSPITAL_COMMUNITY)
Admission: EM | Admit: 2016-08-13 | Discharge: 2016-08-13 | Disposition: A | Discharge status: Home / Self Care | Payer: Medicaid Other | Attending: Emergency Medicine | Admitting: Emergency Medicine

## 2016-08-13 DIAGNOSIS — Z5321 Procedure and treatment not carried out due to patient leaving prior to being seen by health care provider: Secondary | ICD-10-CM | POA: Insufficient documentation

## 2016-08-13 DIAGNOSIS — R110 Nausea: Secondary | ICD-10-CM | POA: Insufficient documentation

## 2016-08-13 LAB — CBC
HEMATOCRIT: 36 % (ref 36.0–46.0)
Hemoglobin: 12.1 g/dL (ref 12.0–15.0)
MCH: 27.6 pg (ref 26.0–34.0)
MCHC: 33.6 g/dL (ref 30.0–36.0)
MCV: 82.2 fL (ref 78.0–100.0)
Platelets: 333 10*3/uL (ref 150–400)
RBC: 4.38 MIL/uL (ref 3.87–5.11)
RDW: 13.8 % (ref 11.5–15.5)
WBC: 7.2 10*3/uL (ref 4.0–10.5)

## 2016-08-13 LAB — I-STAT CHEM 8, ED
BUN: 4 mg/dL — AB (ref 6–20)
CHLORIDE: 102 mmol/L (ref 101–111)
Calcium, Ion: 1.2 mmol/L (ref 1.15–1.40)
Creatinine, Ser: 0.7 mg/dL (ref 0.44–1.00)
Glucose, Bld: 87 mg/dL (ref 65–99)
HEMATOCRIT: 35 % — AB (ref 36.0–46.0)
Hemoglobin: 11.9 g/dL — ABNORMAL LOW (ref 12.0–15.0)
Potassium: 3.2 mmol/L — ABNORMAL LOW (ref 3.5–5.1)
SODIUM: 138 mmol/L (ref 135–145)
TCO2: 24 mmol/L (ref 0–100)

## 2016-08-13 LAB — I-STAT BETA HCG BLOOD, ED (MC, WL, AP ONLY): I-stat hCG, quantitative: >2000 m[IU]/mL — ABNORMAL HIGH (ref <5)

## 2016-08-13 NOTE — ED Triage Notes (Signed)
Pt sts nausea and abd pain in upper area; pt sts LMP was in November

## 2016-08-13 NOTE — ED Notes (Signed)
Pt called multiple times in waiting for recheck of vs and no response.

## 2016-08-14 ENCOUNTER — Emergency Department (HOSPITAL_COMMUNITY): Payer: Medicaid Other

## 2016-08-14 ENCOUNTER — Encounter (HOSPITAL_COMMUNITY): Payer: Self-pay | Admitting: *Deleted

## 2016-08-14 ENCOUNTER — Emergency Department (HOSPITAL_COMMUNITY)
Admission: EM | Admit: 2016-08-14 | Discharge: 2016-08-14 | Disposition: A | Discharge status: Home / Self Care | Payer: Medicaid Other | Attending: Emergency Medicine | Admitting: Emergency Medicine

## 2016-08-14 DIAGNOSIS — Z79899 Other long term (current) drug therapy: Secondary | ICD-10-CM | POA: Diagnosis not present

## 2016-08-14 DIAGNOSIS — Z3A09 9 weeks gestation of pregnancy: Secondary | ICD-10-CM | POA: Insufficient documentation

## 2016-08-14 DIAGNOSIS — R110 Nausea: Secondary | ICD-10-CM

## 2016-08-14 DIAGNOSIS — O26891 Other specified pregnancy related conditions, first trimester: Secondary | ICD-10-CM | POA: Insufficient documentation

## 2016-08-14 LAB — URINALYSIS, ROUTINE W REFLEX MICROSCOPIC
BILIRUBIN URINE: NEGATIVE
Glucose, UA: NEGATIVE mg/dL
Hgb urine dipstick: NEGATIVE
KETONES UR: NEGATIVE mg/dL
LEUKOCYTES UA: NEGATIVE
NITRITE: NEGATIVE
PROTEIN: 30 mg/dL — AB
SPECIFIC GRAVITY, URINE: 1.027 (ref 1.005–1.030)
pH: 5 (ref 5.0–8.0)

## 2016-08-14 MED ORDER — PRENATAL VITAMINS 28-0.8 MG PO TABS: ORAL_TABLET | ORAL | 2 refills | Status: DC

## 2016-08-14 NOTE — ED Triage Notes (Signed)
Pt reports abdominal pain for several weeks with nausea and vomitting recently. vomitting x6 in the last 24 hrs.

## 2016-08-14 NOTE — ED Notes (Signed)
Patient transported to Ultrasound 

## 2016-08-14 NOTE — ED Provider Notes (Signed)
[facility]-EMERGENCY DEPT Provider Note   CSN: [id number] Arrival date & time: 08/14/16  0818     History   Chief Complaint Chief Complaint  Patient presents with  . Abdominal Pain    HPI Heather Paul is a 21 y.o. female.  The history is provided by the patient. No language interpreter was used.  Abdominal Pain   This is a new problem.    History reviewed. No pertinent past medical history.  There are no active problems to display for this patient.   Past Surgical History:  Procedure Laterality Date  . NO PAST SURGERIES      OB History    Gravida Para Term Preterm AB Living   0 0 0 0 0 0   SAB TAB Ectopic Multiple Live Births   0 0 0 0         Home Medications    Prior to Admission medications   Medication Sig Start Date End Date Taking? Authorizing Provider  acetaminophen-codeine 120-12 MG/5ML solution Take 10 mLs by mouth every 4 (four) hours as needed for moderate pain. Patient not taking: Reported on 02/21/2016 07/23/15   Charlestine Night, PA-C  famotidine (PEPCID) 20 MG tablet Take 1 tablet (20 mg total) by mouth 2 (two) times daily. Patient not taking: Reported on 02/21/2016 05/29/15   Shawn C Joy, PA-C  Guaifenesin 1200 MG TB12 Take 1 tablet (1,200 mg total) by mouth 2 (two) times daily. Patient not taking: Reported on 02/21/2016 07/23/15   Charlestine Night, PA-C  ibuprofen (ADVIL,MOTRIN) 800 MG tablet Take 1 tablet (800 mg total) by mouth every 8 (eight) hours as needed. Patient not taking: Reported on 02/21/2016 07/23/15   Charlestine Night, PA-C  metroNIDAZOLE (FLAGYL) 500 MG tablet Take 1 tablet (500 mg total) by mouth 2 (two) times daily. 05/25/16   Audry Pili, PA-C  Prenatal Vit-Fe Fumarate-FA (PRENATAL VITAMINS) 28-0.8 MG TABS One a day 08/14/16   Elson Areas, PA-C    Family History Family History  Problem Relation Age of Onset  . Hypertension Mother   . Diabetes Mother     boderline  . Stroke Maternal Grandmother     Social  History Social History  Substance Use Topics  . Smoking status: Never Smoker  . Smokeless tobacco: Never Used  . Alcohol use No     Allergies   Patient has no known allergies.   Review of Systems Review of Systems  Gastrointestinal: Positive for abdominal pain.  All other systems reviewed and are negative.    Physical Exam Updated Vital Signs BP 107/57   Pulse 84   Temp 98.5 F (36.9 C) (Oral)   Resp 18   LMP 06/21/2016   SpO2 97%   Physical Exam  Constitutional: She appears well-developed and well-nourished. No distress.  HENT:  Head: Normocephalic and atraumatic.  Right Ear: External ear normal.  Left Ear: External ear normal.  Eyes: Conjunctivae and EOM are normal. Pupils are equal, round, and reactive to light.  Neck: Neck supple.  Cardiovascular: Normal rate and regular rhythm.   No murmur heard. Pulmonary/Chest: Effort normal and breath sounds normal. No respiratory distress.  Abdominal: Soft. There is no tenderness.  Musculoskeletal: She exhibits no edema.  Neurological: She is alert.  Skin: Skin is warm and dry.  Psychiatric: She has a normal mood and affect.  Nursing note and vitals reviewed. Pt was here yesterday.  Pt had a positive pregnancy test.     ED Treatments / Results  Labs (all  labs ordered are listed, but only abnormal results are displayed) Labs Reviewed  URINALYSIS, ROUTINE W REFLEX MICROSCOPIC - Abnormal; Notable for the following:       Result Value   Color, Urine AMBER (*)    APPearance HAZY (*)    Protein, ur 30 (*)    Bacteria, UA RARE (*)    Squamous Epithelial / LPF 6-30 (*)    All other components within normal limits    EKG  EKG Interpretation None       Radiology US Ob Comp Less 14 Wks  Result Date: 08/14/2016 CLINICAL DATA:  21 year old female with several weeks of pain, nausea vomiting. Estimated gestational age by LMP 6 weeks and 4 days. Initial encounter. EXAM: OBSTETRIC <14 WK [place] AND TRANSVAGINAL OB US  TECHNIQUE: Both transabdominal and transvaginal ultrasound examinations were performed for complete evaluation of the gestation as well as the maternal uterus, adnexal regions, and pelvic cul-de-sac. Transvaginal technique was performed to assess early pregnancy. COMPARISON:  None. FINDINGS: Intrauterine gestational sac: Single Yolk sac:  Visible Embryo:  Visible Cardiac Activity: Detected Heart Rate: 175  bpm CRL:  28.2  mm   9 w   5 d                  [place] EDC: 03/14/2017 Subchorionic hemorrhage:  None visualized. Maternal uterus/adnexae: The right ovary could not be visualized. The left ovary is visible by transabdominal technique and appears normal measuring 2.6 x 2.5 x 2.8 cm. No pelvic free fluid. IMPRESSION: Single living IUP demonstrated. No acute maternal findings visualized. Electronically Signed   By: Odessa Fleming M.D.   On: 08/14/2016 12:46   US Ob Transvaginal  Result Date: 08/14/2016 CLINICAL DATA:  21 year old female with several weeks of pain, nausea vomiting. Estimated gestational age by LMP 6 weeks and 4 days. Initial encounter. EXAM: OBSTETRIC <14 WK [place] AND TRANSVAGINAL OB US TECHNIQUE: Both transabdominal and transvaginal ultrasound examinations were performed for complete evaluation of the gestation as well as the maternal uterus, adnexal regions, and pelvic cul-de-sac. Transvaginal technique was performed to assess early pregnancy. COMPARISON:  None. FINDINGS: Intrauterine gestational sac: Single Yolk sac:  Visible Embryo:  Visible Cardiac Activity: Detected Heart Rate: 175  bpm CRL:  28.2  mm   9 w   5 d                  [place] EDC: 03/14/2017 Subchorionic hemorrhage:  None visualized. Maternal uterus/adnexae: The right ovary could not be visualized. The left ovary is visible by transabdominal technique and appears normal measuring 2.6 x 2.5 x 2.8 cm. No pelvic free fluid. IMPRESSION: Single living IUP demonstrated. No acute maternal findings visualized. Electronically Signed   By: Odessa Fleming M.D.   On:  08/14/2016 12:46    Procedures Procedures (including critical care time)  Medications Ordered in ED Medications - No data to display   Initial Impression / Assessment and Plan / ED Course  I have reviewed the triage vital signs and the nursing notes.  Pertinent labs & imaging results that were available during my care of the patient were reviewed by me and considered in my medical decision making (see chart for details).  Clinical Course     Pt counseled on prenatal care.  Pt given rx for prenatal vitamins.  Final Clinical Impressions(s) / ED Diagnoses   Final diagnoses:  [redacted] weeks gestation of pregnancy  Nausea    New Prescriptions Discharge Medication List as of 08/14/2016  1:38 PM    START taking these medications   Details  Prenatal Vit-Fe Fumarate-FA (PRENATAL VITAMINS) 28-0.8 MG TABS One a day, Print      An After Visit Summary was printed and given to the patient.    Lonia SkinnerLeslie K BoswellSofia, PA-C 08/14/16 1522    Raeford RazorStephen Kohut, MD 08/16/16 [phone]

## 2016-08-26 ENCOUNTER — Encounter (HOSPITAL_COMMUNITY): Payer: Self-pay | Admitting: *Deleted

## 2016-08-26 ENCOUNTER — Inpatient Hospital Stay (HOSPITAL_COMMUNITY)
Admission: AD | Admit: 2016-08-26 | Discharge: 2016-08-26 | Disposition: A | Discharge status: Home / Self Care | Payer: Medicaid Other | Source: Ambulatory Visit | Attending: Obstetrics & Gynecology | Admitting: Obstetrics & Gynecology

## 2016-08-26 DIAGNOSIS — R112 Nausea with vomiting, unspecified: Secondary | ICD-10-CM | POA: Insufficient documentation

## 2016-08-26 DIAGNOSIS — K920 Hematemesis: Secondary | ICD-10-CM

## 2016-08-26 DIAGNOSIS — O218 Other vomiting complicating pregnancy: Secondary | ICD-10-CM | POA: Insufficient documentation

## 2016-08-26 DIAGNOSIS — Z3A08 8 weeks gestation of pregnancy: Secondary | ICD-10-CM | POA: Insufficient documentation

## 2016-08-26 DIAGNOSIS — O219 Vomiting of pregnancy, unspecified: Secondary | ICD-10-CM

## 2016-08-26 HISTORY — DX: Other specified health status: Z78.9

## 2016-08-26 LAB — CBC
HCT: 33.6 % — ABNORMAL LOW (ref 36.0–46.0)
Hemoglobin: 11.6 g/dL — ABNORMAL LOW (ref 12.0–15.0)
MCH: 28.1 pg (ref 26.0–34.0)
MCHC: 34.5 g/dL (ref 30.0–36.0)
MCV: 81.4 fL (ref 78.0–100.0)
PLATELETS: 309 10*3/uL (ref 150–400)
RBC: 4.13 MIL/uL (ref 3.87–5.11)
RDW: 13.9 % (ref 11.5–15.5)
WBC: 9.3 10*3/uL (ref 4.0–10.5)

## 2016-08-26 LAB — OB RESULTS CONSOLE RPR: RPR: NONREACTIVE

## 2016-08-26 LAB — URINALYSIS, ROUTINE W REFLEX MICROSCOPIC
Bilirubin Urine: NEGATIVE
GLUCOSE, UA: NEGATIVE mg/dL
Hgb urine dipstick: NEGATIVE
Ketones, ur: 20 mg/dL — AB
Leukocytes, UA: NEGATIVE
Nitrite: NEGATIVE
PH: 5 (ref 5.0–8.0)
PROTEIN: 30 mg/dL — AB
Specific Gravity, Urine: 1.03 (ref 1.005–1.030)

## 2016-08-26 LAB — OB RESULTS CONSOLE RUBELLA ANTIBODY, IGM: RUBELLA: NON-IMMUNE/NOT IMMUNE

## 2016-08-26 LAB — OB RESULTS CONSOLE HIV ANTIBODY (ROUTINE TESTING): HIV: NONREACTIVE

## 2016-08-26 LAB — OB RESULTS CONSOLE HEPATITIS B SURFACE ANTIGEN: HEP B S AG: NEGATIVE

## 2016-08-26 MED ORDER — METOCLOPRAMIDE HCL 10 MG PO TABS: 10.0000 mg | ORAL_TABLET | Freq: Three times a day (TID) | ORAL | 0 refills | Status: DC

## 2016-08-26 MED ORDER — RANITIDINE HCL 150 MG PO TABS: 150.0000 mg | ORAL_TABLET | Freq: Two times a day (BID) | ORAL | 0 refills | Status: DC

## 2016-08-26 NOTE — MAU Note (Signed)
Pt states she started coughing up blood today, has been coughing for the past 2 weeks, denies fever or sore throat.  Also has vomiting - usually once a day, denies diarrhea.  No abdominal pain or bleeding.

## 2016-08-26 NOTE — Discharge Instructions (Signed)
Hematemesis Introduction Hematemesis is when you vomit blood. It is a sign of bleeding in the upper part of your digestive tract. This is also called your gastrointestinal (GI) tract. Your upper GI tract includes your mouth, throat, esophagus, stomach, and the first part of your small intestine (duodenum). Hematemesis is usually caused by bleeding from your esophagus or stomach. You may suddenly vomit bright red blood. You might also vomit old blood. It may look like coffee grounds. You may also have other symptoms, such as:  Stomach pain.  Heartburn.  Black and tarry stool. Follow these instructions at home: Watch your hematemesis for any changes. The following actions may help to lessen any discomfort you are feeling:  Take medicines only as directed by your health care provider. Do not take aspirin, ibuprofen, or any other anti-inflammatory medicine without approval from your health care provider.  Rest as needed.  Drink small sips of clear liquids often, as long as you can keep them down. Try to drink enough fluids to keep your urine clear or pale yellow.  Do not drink alcohol.  Do not use any tobacco products, including cigarettes, chewing tobacco, or electronic cigarettes. If you need help quitting, ask your health care provider.  Keep all follow-up visits as directed by your health care provider. This is important. Contact a health care provider if:  The vomiting of blood worsens, or begins again after it has stopped.  You have persistent stomach pain.  You have nausea, indigestion, or heartburn.  You feel weak or dizzy. Get help right away if:  You faint or feel extremely weak.  You have a rapid heartbeat.  You are urinating less than normal or not at all.  You have persistent vomiting.  You vomit large amounts of bloody or dark material.  You vomit bright red blood.  You pass large, dark, or bloody stools.  You have chest pain or trouble breathing. This  information is not intended to replace advice given to you by your health care provider. Make sure you discuss any questions you have with your health care provider. Document Released: 08/29/2004 Document Revised: 12/28/2015 Document Reviewed: 03/16/2014  2017 Elsevier   Nausea and Vomiting, Adult Introduction Feeling sick to your stomach (nausea) means that your stomach is upset or you feel like you have to throw up (vomit). Feeling more and more sick to your stomach can lead to throwing up. Throwing up happens when food and liquid from your stomach are thrown up and out the mouth. Throwing up can make you feel weak and cause you to get dehydrated. Dehydration can make you tired and thirsty, make you have a dry mouth, and make it so you pee (urinate) less often. Older adults and people with other diseases or a weak defense system (immune system) are at higher risk for dehydration. If you feel sick to your stomach or if you throw up, it is important to follow instructions from your doctor about how to take care of yourself. Follow these instructions at home: Eating and drinking Follow these instructions as told by your doctor:  Take an oral rehydration solution (ORS). This is a drink that is sold at pharmacies and stores.  Drink clear fluids in small amounts as you are able, such as:  Water.  Ice chips.  Diluted fruit juice.  Low-calorie sports drinks.  Eat bland, easy-to-digest foods in small amounts as you are able, such as:  Bananas.  Applesauce.  Rice.  Low-fat (lean) meats.  Toast.  Crackers.  Avoid fluids that have a lot of sugar or caffeine in them.  Avoid alcohol.  Avoid spicy or fatty foods. General instructions  Drink enough fluid to keep your pee (urine) clear or pale yellow.  Wash your hands often. If you cannot use soap and water, use hand sanitizer.  Make sure that all people in your home wash their hands well and often.  Take over-the-counter and  prescription medicines only as told by your doctor.  Rest at home while you get better.  Watch your condition for any changes.  Breathe slowly and deeply when you feel sick to your stomach.  Keep all follow-up visits as told by your doctor. This is important. Contact a doctor if:  You have a fever.  You cannot keep fluids down.  Your symptoms get worse.  You have new symptoms.  You feel sick to your stomach for more than two days.  You feel light-headed or dizzy.  You have a headache.  You have muscle cramps. Get help right away if:  You have pain in your chest, neck, arm, or jaw.  You feel very weak or you pass out (faint).  You throw up again and again.  You see blood in your throw-up.  Your throw-up looks like black coffee grounds.  You have bloody or black poop (stools) or poop that look like tar.  You have a very bad headache, a stiff neck, or both.  You have a rash.  You have very bad pain, cramping, or bloating in your belly (abdomen).  You have trouble breathing.  You are breathing very quickly.  Your heart is beating very quickly.  Your skin feels cold and clammy.  You feel confused.  You have pain when you pee.  You have signs of dehydration, such as:  Dark pee, hardly any pee, or no pee.  Cracked lips.  Dry mouth.  Sunken eyes.  Sleepiness.  Weakness. These symptoms may be an emergency. Do not wait to see if the symptoms will go away. Get medical help right away. Call your local emergency services (911 in the [place] Do not drive yourself to the hospital.  This information is not intended to replace advice given to you by your health care provider. Make sure you discuss any questions you have with your health care provider. Document Released: 01/08/2008 Document Revised: 02/09/2016 Document Reviewed: 03/28/2015  2017 Elsevier

## 2016-08-26 NOTE — MAU Provider Note (Signed)
History     CSN: [id number]  Arrival date and time: 08/26/16 1431   First Provider Initiated Contact with Patient 08/26/16 1753      Chief Complaint  Patient presents with  . coughing up blood   HPI   Heather Paul is a 21 y.o. female G1P0000 @ 10522w4d here in MAU with N/V and one episode of blood in her vomit. It happened today just one time. It was bright red in color. She also complains of changes in her HR. She feels that her HR goes really high and then goes slow. She cannt tell sometimes if her HR is fast or slow.  This happens randomly. Position does not make it better or worse. She denies shortness of breath.   She denies vaginal bleeding or abdominal pain.   OB History    Gravida Para Term Preterm AB Living   1 0 0 0 0 0   SAB TAB Ectopic Multiple Live Births   0 0 0 0        Past Medical History:  Diagnosis Date  . Medical history non-contributory     Past Surgical History:  Procedure Laterality Date  . NO PAST SURGERIES      Family History  Problem Relation Age of Onset  . Hypertension Mother   . Diabetes Mother     boderline  . Stroke Maternal Grandmother     Social History  Substance Use Topics  . Smoking status: Never Smoker  . Smokeless tobacco: Never Used  . Alcohol use No    Allergies: No Known Allergies  Prescriptions Prior to Admission  Medication Sig Dispense Refill Last Dose  . acetaminophen-codeine 120-12 MG/5ML solution Take 10 mLs by mouth every 4 (four) hours as needed for moderate pain. (Patient not taking: Reported on 02/21/2016) 120 mL 0 Completed Course at Unknown time  . famotidine (PEPCID) 20 MG tablet Take 1 tablet (20 mg total) by mouth 2 (two) times daily. (Patient not taking: Reported on 02/21/2016) 30 tablet 0 Completed Course at Unknown time  . Guaifenesin 1200 MG TB12 Take 1 tablet (1,200 mg total) by mouth 2 (two) times daily. (Patient not taking: Reported on 02/21/2016) 20 each 0 Completed Course at Unknown time  .  ibuprofen (ADVIL,MOTRIN) 800 MG tablet Take 1 tablet (800 mg total) by mouth every 8 (eight) hours as needed. (Patient not taking: Reported on 02/21/2016) 21 tablet 0 Completed Course at Unknown time  . metroNIDAZOLE (FLAGYL) 500 MG tablet Take 1 tablet (500 mg total) by mouth 2 (two) times daily. 14 tablet 0   . Prenatal Vit-Fe Fumarate-FA (PRENATAL VITAMINS) 28-0.8 MG TABS One a day 30 tablet 2    Results for orders placed or performed during the hospital encounter of 08/26/16 (from the past 48 hour(s))  CBC     Status: Abnormal   Collection Time: 08/26/16  4:45 PM  Result Value Ref Range   WBC 9.3 4.0 - 10.5 K/uL   RBC 4.13 3.87 - 5.11 MIL/uL   Hemoglobin 11.6 (L) 12.0 - 15.0 g/dL   HCT 40.933.6 (L) 81.136.0 - 91.446.0 %   MCV 81.4 78.0 - 100.0 fL   MCH 28.1 26.0 - 34.0 pg   MCHC 34.5 30.0 - 36.0 g/dL   RDW 78.213.9 95.611.5 - 21.315.5 %   Platelets 309 150 - 400 K/uL  Urinalysis, Routine w reflex microscopic     Status: Abnormal   Collection Time: 08/26/16  5:25 PM  Result Value Ref Range  Color, Urine YELLOW YELLOW   APPearance HAZY (A) CLEAR   Specific Gravity, Urine 1.030 1.005 - 1.030   pH 5.0 5.0 - 8.0   Glucose, UA NEGATIVE NEGATIVE mg/dL   Hgb urine dipstick NEGATIVE NEGATIVE   Bilirubin Urine NEGATIVE NEGATIVE   Ketones, ur 20 (A) NEGATIVE mg/dL   Protein, ur 30 (A) NEGATIVE mg/dL   Nitrite NEGATIVE NEGATIVE   Leukocytes, UA NEGATIVE NEGATIVE   RBC / HPF 0-5 0 - 5 RBC/hpf   WBC, UA 0-5 0 - 5 WBC/hpf   Bacteria, UA RARE (A) NONE SEEN   Squamous Epithelial / LPF 6-30 (A) NONE SEEN   Mucous PRESENT     Review of Systems Physical Exam   Blood pressure 120/68, pulse 87, temperature 98 F (36.7 C), temperature source Oral, resp. rate 18, height 5\' 7"  (1.702 m), weight 270 lb (122.5 kg), last menstrual period 06/27/2016, SpO2 98 %.  Physical Exam  Constitutional: She is oriented to person, place, and time. She appears well-developed and well-nourished. No distress.  HENT:  Head:  Normocephalic.  Eyes: Pupils are equal, round, and reactive to light.  Cardiovascular: Normal rate.   Respiratory: Effort normal.  GI: Soft.  Musculoskeletal: Normal range of motion.  Neurological: She is alert and oriented to person, place, and time.  Skin: Skin is warm. She is not diaphoretic.  Psychiatric: Her behavior is normal.    MAU Course  Procedures  None  MDM  EKG normal  Orthostatic vitals normal UA without signs of severe dehydration   Assessment and Plan   A:  1. Hematemesis with nausea   2. Nausea and vomiting in pregnancy     P:  Discharge home in stable condition Rx: Reglan, Zantac Return to MAU if symptoms worsen Start prenatal care First trimester warning signs discussed   Duane Lope, NP 08/26/2016 7:33 PM

## 2016-09-13 ENCOUNTER — Other Ambulatory Visit: Payer: Self-pay | Admitting: Obstetrics & Gynecology

## 2016-09-16 LAB — CYTOLOGY - PAP

## 2016-09-19 ENCOUNTER — Inpatient Hospital Stay (HOSPITAL_COMMUNITY)
Admission: AD | Admit: 2016-09-19 | Discharge: 2016-09-19 | Disposition: A | Discharge status: Home / Self Care | Payer: Medicaid Other | Source: Ambulatory Visit | Attending: Obstetrics & Gynecology | Admitting: Obstetrics & Gynecology

## 2016-09-19 ENCOUNTER — Encounter (HOSPITAL_COMMUNITY): Payer: Self-pay

## 2016-09-19 DIAGNOSIS — O26892 Other specified pregnancy related conditions, second trimester: Secondary | ICD-10-CM | POA: Diagnosis not present

## 2016-09-19 DIAGNOSIS — Z3A15 15 weeks gestation of pregnancy: Secondary | ICD-10-CM | POA: Diagnosis not present

## 2016-09-19 DIAGNOSIS — R102 Pelvic and perineal pain: Secondary | ICD-10-CM | POA: Diagnosis not present

## 2016-09-19 DIAGNOSIS — O26899 Other specified pregnancy related conditions, unspecified trimester: Secondary | ICD-10-CM | POA: Diagnosis not present

## 2016-09-19 DIAGNOSIS — R1032 Left lower quadrant pain: Secondary | ICD-10-CM | POA: Diagnosis present

## 2016-09-19 LAB — URINALYSIS, ROUTINE W REFLEX MICROSCOPIC
Bilirubin Urine: NEGATIVE
GLUCOSE, UA: NEGATIVE mg/dL
HGB URINE DIPSTICK: NEGATIVE
Ketones, ur: NEGATIVE mg/dL
NITRITE: NEGATIVE
PH: 5 (ref 5.0–8.0)
PROTEIN: NEGATIVE mg/dL
SPECIFIC GRAVITY, URINE: 1.028 (ref 1.005–1.030)

## 2016-09-19 LAB — WET PREP, GENITAL
Clue Cells Wet Prep HPF POC: NONE SEEN
SPERM: NONE SEEN
Trich, Wet Prep: NONE SEEN
Yeast Wet Prep HPF POC: NONE SEEN

## 2016-09-19 MED ORDER — ACETAMINOPHEN 325 MG PO TABS
650.0000 mg | ORAL_TABLET | Freq: Once | ORAL | Status: AC
Start: 2016-09-19 — End: 2016-09-19
  Administered 2016-09-19: 650 mg via ORAL
  Filled 2016-09-19: qty 2

## 2016-09-19 NOTE — Discharge Instructions (Signed)

## 2016-09-19 NOTE — MAU Note (Signed)
Pt presents complaining of pain on the left lower side of her abdomen. States it started 2 days ago. Has not tried any pain medication. Denies bleeding or leaking.

## 2016-09-19 NOTE — MAU Provider Note (Signed)
History     CSN: [id number]  Arrival date and time: 09/19/16 [id number]   First Provider Initiated Contact with Patient 09/19/16 [phone]      Chief Complaint  Patient presents with  . Pelvic Pain   Heather Paul is a 21 y.o. G1P0000 at 3122w4d who presents today with left lower abdominal pain. She was seen in the office, and has a documented IUP. She denies any vaginal bleeding. She states that she is being treated for BV, and has her last dose of medication today. She denies any vaginal discharge at this time.    Pelvic Pain  The patient's primary symptoms include pelvic pain. The patient's pertinent negatives include no vaginal discharge. This is a new problem. The current episode started yesterday. The problem occurs intermittently. The problem has been unchanged. Pain severity now: 7/10. The problem affects the left side. She is pregnant. Associated symptoms include nausea. Pertinent negatives include no chills, constipation, diarrhea, dysuria, fever, frequency, urgency or vomiting. The vaginal discharge was normal. There has been no bleeding. The symptoms are aggravated by activity. She has tried nothing for the symptoms. The treatment provided no relief. She is sexually active. Her menstrual history has been regular (LMP 06/27/16).   Past Medical History:  Diagnosis Date  . Medical history non-contributory     Past Surgical History:  Procedure Laterality Date  . NO PAST SURGERIES      Family History  Problem Relation Age of Onset  . Hypertension Mother   . Diabetes Mother     boderline  . Stroke Maternal Grandmother     Social History  Substance Use Topics  . Smoking status: Never Smoker  . Smokeless tobacco: Never Used  . Alcohol use No    Allergies: No Known Allergies  Prescriptions Prior to Admission  Medication Sig Dispense Refill Last Dose  . metoCLOPramide (REGLAN) 10 MG tablet Take 1 tablet (10 mg total) by mouth 4 (four) times daily -  before meals and at bedtime.  30 tablet 0   . Prenatal Vit-Fe Fumarate-FA (PRENATAL VITAMINS) 28-0.8 MG TABS One a day 30 tablet 2 08/26/2016 at Unknown time  . ranitidine (ZANTAC) 150 MG tablet Take 1 tablet (150 mg total) by mouth 2 (two) times daily. 60 tablet 0     Review of Systems  Constitutional: Negative for chills and fever.  Gastrointestinal: Positive for nausea. Negative for constipation, diarrhea and vomiting.  Genitourinary: Positive for pelvic pain. Negative for dysuria, frequency, urgency, vaginal bleeding and vaginal discharge.   Physical Exam   Blood pressure 136/61, pulse 79, temperature 98.3 F (36.8 C), temperature source Oral, resp. rate 17, last menstrual period 06/27/2016, SpO2 100 %.  Physical Exam  Nursing note and vitals reviewed. Constitutional: She is oriented to person, place, and time. She appears well-developed and well-nourished. No distress.  HENT:  Head: Normocephalic.  Cardiovascular: Normal rate.   Respiratory: Effort normal.  GI: Soft. There is no tenderness. There is no rebound.  Genitourinary:  Genitourinary Comments: Cervix: closed Uterus: AGA, FHT 155 with doppler   Neurological: She is alert and oriented to person, place, and time.  Skin: Skin is warm and dry.  Psychiatric: She has a normal mood and affect.   Results for orders placed or performed during the hospital encounter of 09/19/16 (from the past 24 hour(s))  Urinalysis, Routine w reflex microscopic     Status: Abnormal   Collection Time: 09/19/16  5:55 AM  Result Value Ref Range   Color, Urine YELLOW  YELLOW   APPearance CLOUDY (A) CLEAR   Specific Gravity, Urine 1.028 1.005 - 1.030   pH 5.0 5.0 - 8.0   Glucose, UA NEGATIVE NEGATIVE mg/dL   Hgb urine dipstick NEGATIVE NEGATIVE   Bilirubin Urine NEGATIVE NEGATIVE   Ketones, ur NEGATIVE NEGATIVE mg/dL   Protein, ur NEGATIVE NEGATIVE mg/dL   Nitrite NEGATIVE NEGATIVE   Leukocytes, UA TRACE (A) NEGATIVE   RBC / HPF 0-5 0 - 5 RBC/hpf   WBC, UA 6-30 0 - 5  WBC/hpf   Bacteria, UA FEW (A) NONE SEEN   Squamous Epithelial / LPF 6-30 (A) NONE SEEN   Mucous PRESENT   Wet prep, genital     Status: Abnormal   Collection Time: 09/19/16  6:25 AM  Result Value Ref Range   Yeast Wet Prep HPF POC NONE SEEN NONE SEEN   Trich, Wet Prep NONE SEEN NONE SEEN   Clue Cells Wet Prep HPF POC NONE SEEN NONE SEEN   WBC, Wet Prep HPF POC FEW (A) NONE SEEN   Sperm NONE SEEN     MAU Course  Procedures  MDM Tylenol given. Patient reports that her pain is somewhat better.  Dr. Mora Appl notified, ok for DC home.   Assessment and Plan   1. Pain of round ligament affecting pregnancy, antepartum   2. [redacted] weeks gestation of pregnancy    DC home Comfort measures reviewed  2nd Trimester precautions  RX: none  Return to MAU as needed FU with OB as planned  Follow-up Information    PINN, Sanjuana Mae, MD Follow up.   Specialty:  Obstetrics and Gynecology Contact information: [address] [place] [address] [phone]            [name] 09/19/2016, 6:27 AM

## 2016-10-07 ENCOUNTER — Emergency Department (HOSPITAL_COMMUNITY)
Admission: EM | Admit: 2016-10-07 | Discharge: 2016-10-07 | Disposition: A | Discharge status: Home / Self Care | Payer: Medicaid Other | Attending: Emergency Medicine | Admitting: Emergency Medicine

## 2016-10-07 ENCOUNTER — Encounter (HOSPITAL_COMMUNITY): Payer: Self-pay | Admitting: Emergency Medicine

## 2016-10-07 DIAGNOSIS — Z3A16 16 weeks gestation of pregnancy: Secondary | ICD-10-CM | POA: Insufficient documentation

## 2016-10-07 DIAGNOSIS — O26892 Other specified pregnancy related conditions, second trimester: Secondary | ICD-10-CM | POA: Diagnosis present

## 2016-10-07 DIAGNOSIS — R102 Pelvic and perineal pain: Secondary | ICD-10-CM | POA: Insufficient documentation

## 2016-10-07 DIAGNOSIS — O26899 Other specified pregnancy related conditions, unspecified trimester: Secondary | ICD-10-CM

## 2016-10-07 LAB — I-STAT BETA HCG BLOOD, ED (MC, WL, AP ONLY)

## 2016-10-07 LAB — URINALYSIS, ROUTINE W REFLEX MICROSCOPIC
BILIRUBIN URINE: NEGATIVE
Glucose, UA: NEGATIVE mg/dL
Hgb urine dipstick: NEGATIVE
Ketones, ur: NEGATIVE mg/dL
LEUKOCYTES UA: NEGATIVE
Nitrite: NEGATIVE
PH: 5 (ref 5.0–8.0)
Protein, ur: 30 mg/dL — AB
Specific Gravity, Urine: 1.026 (ref 1.005–1.030)

## 2016-10-07 LAB — LIPASE, BLOOD: Lipase: 18 U/L (ref 11–51)

## 2016-10-07 LAB — CBC
HCT: 33.9 % — ABNORMAL LOW (ref 36.0–46.0)
Hemoglobin: 11.4 g/dL — ABNORMAL LOW (ref 12.0–15.0)
MCH: 27.7 pg (ref 26.0–34.0)
MCHC: 33.6 g/dL (ref 30.0–36.0)
MCV: 82.5 fL (ref 78.0–100.0)
PLATELETS: 304 10*3/uL (ref 150–400)
RBC: 4.11 MIL/uL (ref 3.87–5.11)
RDW: 13.4 % (ref 11.5–15.5)
WBC: 7.4 10*3/uL (ref 4.0–10.5)

## 2016-10-07 LAB — COMPREHENSIVE METABOLIC PANEL
ALT: 21 U/L (ref 14–54)
AST: 21 U/L (ref 15–41)
Albumin: 3.2 g/dL — ABNORMAL LOW (ref 3.5–5.0)
Alkaline Phosphatase: 48 U/L (ref 38–126)
Anion gap: 6 (ref 5–15)
BILIRUBIN TOTAL: 0.2 mg/dL — AB (ref 0.3–1.2)
BUN: <5 mg/dL — ABNORMAL LOW (ref 6–20)
CHLORIDE: 106 mmol/L (ref 101–111)
CO2: 23 mmol/L (ref 22–32)
CREATININE: 0.54 mg/dL (ref 0.44–1.00)
Calcium: 8.8 mg/dL — ABNORMAL LOW (ref 8.9–10.3)
GFR calc Af Amer: >60 mL/min (ref >60)
GLUCOSE: 92 mg/dL (ref 65–99)
Potassium: 3.2 mmol/L — ABNORMAL LOW (ref 3.5–5.1)
Sodium: 135 mmol/L (ref 135–145)
Total Protein: 6.4 g/dL — ABNORMAL LOW (ref 6.5–8.1)

## 2016-10-07 MED ORDER — ACETAMINOPHEN 325 MG PO TABS
650.0000 mg | ORAL_TABLET | Freq: Once | ORAL | Status: AC
  Administered 2016-10-07: 650 mg via ORAL
  Filled 2016-10-07: qty 2

## 2016-10-07 NOTE — ED Provider Notes (Signed)
[facility]-EMERGENCY DEPT Provider Note   CSN: [id number] Arrival date & time: 10/07/16  0522     History   Chief Complaint Chief Complaint  Patient presents with  . Abdominal Pain    HPI Heather Paul is a 21 y.o. female.  HPI   Heather Paul is a 21 y.o. female, with a history of G1P0, presenting to the ED with lower abdominal pain, present for at least a week, but worse today.  Pain is sharp, intermittent, 7/10, bilateral lower abdomen/pelvis, nonradiating. Patient took tylenol last night. Pt was seen at [facility] on 2/15, diagnosed with round ligament pain, and states today's pain is similar. Nothing makes it worse or better. Pt states she is [redacted] weeks pregnant with an IUP confirmed at 14 weeks.  Denies fever/chills, diarrhea, vaginal bleeding, abnormal vaginal discharge, or any other complaints.  Pt has not seen her OBGYN since the pain began, but has an appointment on March 7.   Past Medical History:  Diagnosis Date  . Medical history non-contributory     There are no active problems to display for this patient.   Past Surgical History:  Procedure Laterality Date  . NO PAST SURGERIES      OB History    Gravida Para Term Preterm AB Living   1 0 0 0 0 0   SAB TAB Ectopic Multiple Live Births   0 0 0 0         Home Medications    Prior to Admission medications   Medication Sig Start Date End Date Taking? Authorizing Provider  Prenatal Vit-Fe Fumarate-FA (PRENATAL VITAMINS) 28-0.8 MG TABS One a day 08/14/16  Yes Elson AreasLeslie K Sofia, PA-C    Family History Family History  Problem Relation Age of Onset  . Hypertension Mother   . Diabetes Mother     boderline  . Stroke Maternal Grandmother     Social History Social History  Substance Use Topics  . Smoking status: Never Smoker  . Smokeless tobacco: Never Used  . Alcohol use No     Allergies   Patient has no known allergies.   Review of Systems Review of Systems  Constitutional: Negative for  chills, diaphoresis and fever.  Respiratory: Negative for shortness of breath.   Cardiovascular: Negative for chest pain.  Genitourinary: Positive for pelvic pain. Negative for vaginal bleeding and vaginal discharge.  All other systems reviewed and are negative.    Physical Exam Updated Vital Signs BP 120/72 (BP Location: Left Arm)   Pulse 102   Temp 98.5 F (36.9 C) (Oral)   Resp 18   Ht 5\' 6"  (1.676 m)   Wt 119.7 kg   LMP 06/27/2016   SpO2 98%   BMI 42.61 kg/m   Physical Exam  Constitutional: She appears well-developed and well-nourished. No distress.  HENT:  Head: Normocephalic and atraumatic.  Mouth/Throat: Oropharynx is clear and moist.  Eyes: Conjunctivae are normal.  Neck: Neck supple.  Cardiovascular: Normal rate, regular rhythm, normal heart sounds and intact distal pulses.   Pulmonary/Chest: Effort normal and breath sounds normal. No respiratory distress.  Abdominal: Soft. There is no tenderness. There is no guarding.  There is no tenderness elicited with direct anterior posterior palpation of the abdomen or pelvis. However, when the uterus is shifted laterally, patient voices increase in pain to the side opposite to the direction of the shift. For example, when the uterus is pushed to the right, pain is elicited in the left pelvis. FHR via doppler is  150.   Musculoskeletal: She exhibits no edema.  Lymphadenopathy:    She has no cervical adenopathy.  Neurological: She is alert.  Skin: Skin is warm and dry. She is not diaphoretic.  Psychiatric: She has a normal mood and affect. Her behavior is normal.  Nursing note and vitals reviewed.    ED Treatments / Results  Labs (all labs ordered are listed, but only abnormal results are displayed) Labs Reviewed  COMPREHENSIVE METABOLIC PANEL - Abnormal; Notable for the following:       Result Value   Potassium 3.2 (*)    BUN <5 (*)    Calcium 8.8 (*)    Total Protein 6.4 (*)    Albumin 3.2 (*)    Total Bilirubin  0.2 (*)    All other components within normal limits  CBC - Abnormal; Notable for the following:    Hemoglobin 11.4 (*)    HCT 33.9 (*)    All other components within normal limits  URINALYSIS, ROUTINE W REFLEX MICROSCOPIC - Abnormal; Notable for the following:    APPearance HAZY (*)    Protein, ur 30 (*)    Bacteria, UA RARE (*)    Squamous Epithelial / LPF 6-30 (*)    All other components within normal limits  I-STAT BETA HCG BLOOD, ED ([facility], [facility], [facility] ONLY) - Abnormal; Notable for the following:    I-stat hCG, quantitative >2,000.0 (*)    All other components within normal limits  LIPASE, BLOOD   Hemoglobin  Date Value Ref Range Status  10/07/2016 11.4 (L) 12.0 - 15.0 g/dL Final  16/05/9603 54.0 (L) 12.0 - 15.0 g/dL Final  98/06/9146 82.9 12.0 - 15.0 g/dL Final  56/21/3086 57.8 (L) 12.0 - 15.0 g/dL Final    EKG  EKG Interpretation None       Radiology No results found.  Procedures Procedures (including critical care time)  Medications Ordered in ED Medications  acetaminophen (TYLENOL) tablet 650 mg (650 mg Oral Given 10/07/16 0735)     Initial Impression / Assessment and Plan / ED Course  I have reviewed the triage vital signs and the nursing notes.  Pertinent labs & imaging results that were available during my care of the patient were reviewed by me and considered in my medical decision making (see chart for details).     Patient presents with lower abdominal pain for the last week.  Patient encouraged to keep her upcoming appointment with her OBGYN and discuss this issue at that time. Home care and return precautions discussed, including going straight to [facility] for any future pregnancy-related issues. Pt voices understanding of all instructions and is comfortable with discharge.    Findings and plan of care discussed with Dione Booze, MD.   Vitals:   10/07/16 4696 10/07/16 0700 10/07/16 0733 10/07/16 0821  BP: 120/72 115/69 115/69 114/69  Pulse:  102 93 95 92  Resp: 18 18 17 17   Temp: 98.5 F (36.9 C)     TempSrc: Oral     SpO2: 98% 97% 98% 100%  Weight: 119.7 kg     Height: 5\' 6"  (1.676 m)        Final Clinical Impressions(s) / ED Diagnoses   Final diagnoses:  Pelvic pain during pregnancy    New Prescriptions Discharge Medication List as of 10/07/2016  8:17 AM       Anselm Pancoast, PA-C 10/07/16 2952    Dione Booze, MD 10/07/16 2250

## 2016-10-07 NOTE — ED Triage Notes (Addendum)
Pt states she is [redacted] weeks pregnant- confirmed IUP.  C/o lower abd pain/pelvic pain x 1 week that is gradually getting worse.  [facility]15 for round ligament pain and describes pain to be similar to same.  Denies nausea, vomiting, or any other complaints.

## 2016-10-07 NOTE — ED Notes (Signed)
Pt did not need anything at this time  

## 2016-10-07 NOTE — ED Notes (Signed)
Gave pt a sandwich and a water.

## 2016-10-07 NOTE — Discharge Instructions (Signed)
The fetal heart rate was normal. There were no acute lab abnormalities today. Please keep your upcoming OBGYN appointment and discuss this matter at that time.  You may take 500-650 mg of Tylenol (acetaminophen) every 4-6 hours, as needed for pain. It is helpful to take this medication with food. Please proceed to the Emergency Department at [facility] for any worsening symptoms or future pregnancy-related issues.

## 2016-11-15 ENCOUNTER — Inpatient Hospital Stay (HOSPITAL_COMMUNITY)
Admission: AD | Admit: 2016-11-15 | Discharge: 2016-11-15 | Disposition: A | Discharge status: Home / Self Care | Payer: Medicaid Other | Source: Ambulatory Visit | Attending: Obstetrics and Gynecology | Admitting: Obstetrics and Gynecology

## 2016-11-15 DIAGNOSIS — Z79899 Other long term (current) drug therapy: Secondary | ICD-10-CM | POA: Insufficient documentation

## 2016-11-15 DIAGNOSIS — Z833 Family history of diabetes mellitus: Secondary | ICD-10-CM | POA: Insufficient documentation

## 2016-11-15 DIAGNOSIS — O26892 Other specified pregnancy related conditions, second trimester: Secondary | ICD-10-CM | POA: Insufficient documentation

## 2016-11-15 DIAGNOSIS — B9689 Other specified bacterial agents as the cause of diseases classified elsewhere: Secondary | ICD-10-CM | POA: Diagnosis not present

## 2016-11-15 DIAGNOSIS — Z3A23 23 weeks gestation of pregnancy: Secondary | ICD-10-CM | POA: Diagnosis not present

## 2016-11-15 DIAGNOSIS — Z8249 Family history of ischemic heart disease and other diseases of the circulatory system: Secondary | ICD-10-CM | POA: Insufficient documentation

## 2016-11-15 DIAGNOSIS — N760 Acute vaginitis: Secondary | ICD-10-CM

## 2016-11-15 DIAGNOSIS — Z823 Family history of stroke: Secondary | ICD-10-CM | POA: Insufficient documentation

## 2016-11-15 DIAGNOSIS — O9989 Other specified diseases and conditions complicating pregnancy, childbirth and the puerperium: Secondary | ICD-10-CM | POA: Diagnosis not present

## 2016-11-15 LAB — URINALYSIS, ROUTINE W REFLEX MICROSCOPIC
Bilirubin Urine: NEGATIVE
Glucose, UA: NEGATIVE mg/dL
Hgb urine dipstick: NEGATIVE
Ketones, ur: NEGATIVE mg/dL
Nitrite: NEGATIVE
PH: 7 (ref 5.0–8.0)
Protein, ur: NEGATIVE mg/dL
Specific Gravity, Urine: 1.016 (ref 1.005–1.030)

## 2016-11-15 LAB — WET PREP, GENITAL
Sperm: NONE SEEN
TRICH WET PREP: NONE SEEN
Yeast Wet Prep HPF POC: NONE SEEN

## 2016-11-15 MED ORDER — TERCONAZOLE 0.8 % VA CREA: 1.0000 | TOPICAL_CREAM | Freq: Every day | VAGINAL | 0 refills | Status: DC

## 2016-11-15 MED ORDER — METRONIDAZOLE 500 MG PO TABS: 500.0000 mg | ORAL_TABLET | Freq: Two times a day (BID) | ORAL | 0 refills | Status: DC

## 2016-11-15 NOTE — Discharge Instructions (Signed)
Bacterial Vaginosis Bacterial vaginosis is a vaginal infection that occurs when the normal balance of bacteria in the vagina is disrupted. It results from an overgrowth of certain bacteria. This is the most common vaginal infection among women ages 15-44. Because bacterial vaginosis increases your risk for STIs (sexually transmitted infections), getting treated can help reduce your risk for chlamydia, gonorrhea, herpes, and HIV (human immunodeficiency virus). Treatment is also important for preventing complications in pregnant women, because this condition can cause an early (premature) delivery. What are the causes? This condition is caused by an increase in harmful bacteria that are normally present in small amounts in the vagina. However, the reason that the condition develops is not fully understood. What increases the risk? The following factors may make you more likely to develop this condition:  Having a new sexual partner or multiple sexual partners.  Having unprotected sex.  Douching.  Having an intrauterine device (IUD).  Smoking.  Drug and alcohol abuse.  Taking certain antibiotic medicines.  Being pregnant. You cannot get bacterial vaginosis from toilet seats, bedding, swimming pools, or contact with objects around you. What are the signs or symptoms? Symptoms of this condition include:  Grey or white vaginal discharge. The discharge can also be watery or foamy.  A fish-like odor with discharge, especially after sexual intercourse or during menstruation.  Itching in and around the vagina.  Burning or pain with urination. Some women with bacterial vaginosis have no signs or symptoms. How is this diagnosed? This condition is diagnosed based on:  Your medical history.  A physical exam of the vagina.  Testing a sample of vaginal fluid under a microscope to look for a large amount of bad bacteria or abnormal cells. Your health care provider may use a cotton swab or a  small wooden spatula to collect the sample. How is this treated? This condition is treated with antibiotics. These may be given as a pill, a vaginal cream, or a medicine that is put into the vagina (suppository). If the condition comes back after treatment, a second round of antibiotics may be needed. Follow these instructions at home: Medicines   Take over-the-counter and prescription medicines only as told by your health care provider.  Take or use your antibiotic as told by your health care provider. Do not stop taking or using the antibiotic even if you start to feel better. General instructions   If you have a female sexual partner, tell her that you have a vaginal infection. She should see her health care provider and be treated if she has symptoms. If you have a female sexual partner, he does not need treatment.  During treatment:  Avoid sexual activity until you finish treatment.  Do not douche.  Avoid alcohol as directed by your health care provider.  Avoid breastfeeding as directed by your health care provider.  Drink enough water and fluids to keep your urine clear or pale yellow.  Keep the area around your vagina and rectum clean.  Wash the area daily with warm water.  Wipe yourself from front to back after using the toilet.  Keep all follow-up visits as told by your health care provider. This is important. How is this prevented?  Do not douche.  Wash the outside of your vagina with warm water only.  Use protection when having sex. This includes latex condoms and dental dams.  Limit how many sexual partners you have. To help prevent bacterial vaginosis, it is best to have sex with just one   partner (monogamous).  Make sure you and your sexual partner are tested for STIs.  Wear cotton or cotton-lined underwear.  Avoid wearing tight pants and pantyhose, especially during summer.  Limit the amount of alcohol that you drink.  Do not use any products that contain  nicotine or tobacco, such as cigarettes and e-cigarettes. If you need help quitting, ask your health care provider.  Do not use illegal drugs. Where to find more information:  Centers for Disease Control and Prevention: www.cdc.gov/std  American Sexual Health Association (ASHA): www.ashastd.org  U.S. Department of Health and Human Services, Office on Women's Health: www.womenshealth.gov/ or https://www.womenshealth.gov/a-z-topics/bacterial-vaginosis Contact a health care provider if:  Your symptoms do not improve, even after treatment.  You have more discharge or pain when urinating.  You have a fever.  You have pain in your abdomen.  You have pain during sex.  You have vaginal bleeding between periods. Summary  Bacterial vaginosis is a vaginal infection that occurs when the normal balance of bacteria in the vagina is disrupted.  Because bacterial vaginosis increases your risk for STIs (sexually transmitted infections), getting treated can help reduce your risk for chlamydia, gonorrhea, herpes, and HIV (human immunodeficiency virus). Treatment is also important for preventing complications in pregnant women, because the condition can cause an early (premature) delivery.  This condition is treated with antibiotic medicines. These may be given as a pill, a vaginal cream, or a medicine that is put into the vagina (suppository). This information is not intended to replace advice given to you by your health care provider. Make sure you discuss any questions you have with your health care provider. Document Released: 07/22/2005 Document Revised: 04/06/2016 Document Reviewed: 04/06/2016 Elsevier Interactive Patient Education  2017 Elsevier Inc. Vaginal Yeast infection, Adult Vaginal yeast infection is a condition that causes soreness, swelling, and redness (inflammation) of the vagina. It also causes vaginal discharge. This is a common condition. Some women get this infection  frequently. What are the causes? This condition is caused by a change in the normal balance of the yeast (candida) and bacteria that live in the vagina. This change causes an overgrowth of yeast, which causes the inflammation. What increases the risk? This condition is more likely to develop in:  Women who take antibiotic medicines.  Women who have diabetes.  Women who take birth control pills.  Women who are pregnant.  Women who douche often.  Women who have a weak defense (immune) system.  Women who have been taking steroid medicines for a long time.  Women who frequently wear tight clothing. What are the signs or symptoms? Symptoms of this condition include:  White, thick vaginal discharge.  Swelling, itching, redness, and irritation of the vagina. The lips of the vagina (vulva) may be affected as well.  Pain or a burning feeling while urinating.  Pain during sex. How is this diagnosed? This condition is diagnosed with a medical history and physical exam. This will include a pelvic exam. Your health care provider will examine a sample of your vaginal discharge under a microscope. Your health care provider may send this sample for testing to confirm the diagnosis. How is this treated? This condition is treated with medicine. Medicines may be over-the-counter or prescription. You may be told to use one or more of the following:  Medicine that is taken orally.  Medicine that is applied as a cream.  Medicine that is inserted directly into the vagina (suppository). Follow these instructions at home:  Take or apply over-the-counter   and prescription medicines only as told by your health care provider.  Do not have sex until your health care provider has approved. Tell your sex partner that you have a yeast infection. That person should go to his or her health care provider if he or she develops symptoms.  Do not wear tight clothes, such as pantyhose or tight pants.  Avoid  using tampons until your health care provider approves.  Eat more yogurt. This may help to keep your yeast infection from returning.  Try taking a sitz bath to help with discomfort. This is a warm water bath that is taken while you are sitting down. The water should only come up to your hips and should cover your buttocks. Do this 3-4 times per day or as told by your health care provider.  Do not douche.  Wear breathable, cotton underwear.  If you have diabetes, keep your blood sugar levels under control. Contact a health care provider if:  You have a fever.  Your symptoms go away and then return.  Your symptoms do not get better with treatment.  Your symptoms get worse.  You have new symptoms.  You develop blisters in or around your vagina.  You have blood coming from your vagina and it is not your menstrual period.  You develop pain in your abdomen. This information is not intended to replace advice given to you by your health care provider. Make sure you discuss any questions you have with your health care provider. Document Released: 05/01/2005 Document Revised: 01/03/2016 Document Reviewed: 01/23/2015 Elsevier Interactive Patient Education  2017 Elsevier Inc.  

## 2016-11-15 NOTE — MAU Provider Note (Signed)
History     CSN: [id number]  Arrival date and time: 11/15/16 1419   First Provider Initiated Contact with Patient 11/15/16 1529      Chief Complaint  Patient presents with  . Vaginal Itching   Heather Paul is a 21 y.o. G1P0000 at [redacted]w[redacted]d who presents with vaginal irritation and discharge. Symptoms began 2 days ago. Has not tried treatment. Denies abdominal pain, vaginal bleeding, or LOF. Positive fetal movement.    Vaginal Itching  The patient's primary symptoms include genital itching and vaginal discharge. The patient's pertinent negatives include no genital lesions, genital odor or vaginal bleeding. This is a new problem. Episode onset: x2 days. The problem occurs constantly. The problem has been gradually worsening. Pertinent negatives include no abdominal pain, back pain, dysuria, fever, nausea or vomiting. Associated symptoms comments: No vaginal bleeding. The vaginal discharge was white and thick. There has been no bleeding. Nothing aggravates the symptoms. She has tried nothing for the symptoms. She is not sexually active. Her past medical history is significant for vaginosis. There is no history of herpes simplex.   OB History    Gravida Para Term Preterm AB Living   1 0 0 0 0 0   SAB TAB Ectopic Multiple Live Births   0 0 0 0        Past Medical History:  Diagnosis Date  . Medical history non-contributory     Past Surgical History:  Procedure Laterality Date  . NO PAST SURGERIES      Family History  Problem Relation Age of Onset  . Hypertension Mother   . Diabetes Mother     boderline  . Stroke Maternal Grandmother     Social History  Substance Use Topics  . Smoking status: Never Smoker  . Smokeless tobacco: Never Used  . Alcohol use No    Allergies: No Known Allergies  Prescriptions Prior to Admission  Medication Sig Dispense Refill Last Dose  . Prenatal Vit-Fe Fumarate-FA (PRENATAL VITAMINS) 28-0.8 MG TABS One a day 30 tablet 2 11/15/2016 at Unknown  time    Review of Systems  Constitutional: Negative.  Negative for fever.  Gastrointestinal: Negative.  Negative for abdominal pain, nausea and vomiting.  Genitourinary: Positive for vaginal discharge and vaginal pain. Negative for dysuria and vaginal bleeding.  Musculoskeletal: Negative for back pain.   Physical Exam   Blood pressure 124/79, pulse (!) 105, temperature 98.6 F (37 C), temperature source Oral, resp. rate 18, weight 268 lb 12 oz (121.9 kg), last menstrual period 06/27/2016, SpO2 98 %.  Physical Exam  Nursing note and vitals reviewed. Constitutional: She is oriented to person, place, and time. She appears well-developed and well-nourished. No distress.  HENT:  Head: Normocephalic and atraumatic.  Eyes: Conjunctivae are normal. Right eye exhibits no discharge. Left eye exhibits no discharge. No scleral icterus.  Neck: Normal range of motion.  Respiratory: Effort normal. No respiratory distress.  Genitourinary: Cervix exhibits no friability. There is erythema and tenderness in the vagina. No bleeding in the vagina. Vaginal discharge found.  Neurological: She is alert and oriented to person, place, and time.  Skin: Skin is warm and dry. She is not diaphoretic.  Psychiatric: She has a normal mood and affect. Her behavior is normal. Judgment and thought content normal.   Fetal Tracing:  Baseline: 150 Variability: moderate Accelerations: 10x10 Decelerations: small variable x 1  Toco: none MAU Course  Procedures Results for orders placed or performed during the hospital encounter of 11/15/16 (from the past  48 hour(s))  Urinalysis, Routine w reflex microscopic     Status: Abnormal   Collection Time: 11/15/16  2:40 PM  Result Value Ref Range   Color, Urine YELLOW YELLOW   APPearance CLOUDY (A) CLEAR   Specific Gravity, Urine 1.016 1.005 - 1.030   pH 7.0 5.0 - 8.0   Glucose, UA NEGATIVE NEGATIVE mg/dL   Hgb urine dipstick NEGATIVE NEGATIVE   Bilirubin Urine NEGATIVE  NEGATIVE   Ketones, ur NEGATIVE NEGATIVE mg/dL   Protein, ur NEGATIVE NEGATIVE mg/dL   Nitrite NEGATIVE NEGATIVE   Leukocytes, UA MODERATE (A) NEGATIVE   RBC / HPF 0-5 0 - 5 RBC/hpf   WBC, UA 0-5 0 - 5 WBC/hpf   Bacteria, UA MANY (A) NONE SEEN   Squamous Epithelial / LPF 6-30 (A) NONE SEEN   Mucous PRESENT   Wet prep, genital     Status: Abnormal   Collection Time: 11/15/16  3:35 PM  Result Value Ref Range   Yeast Wet Prep HPF POC NONE SEEN NONE SEEN   Trich, Wet Prep NONE SEEN NONE SEEN   Clue Cells Wet Prep HPF POC PRESENT (A) NONE SEEN   WBC, Wet Prep HPF POC MANY (A) NONE SEEN    Comment: BACTERIA- TOO NUMEROUS TO COUNT   Sperm NONE SEEN     MDM Fetal tracing appropriate for [redacted] week gestation VSS, NAD Wet prep + clue cells, no yeast -- will tx for both based on complaint & exam  Assessment and Plan  A: 1. Vulvovaginitis   2. BV (bacterial vaginosis)    P; Discharge home Rx flagyl & terazol Keep f/u with OB Discussed reasons to return to MAU  Judeth Horn 11/15/2016, 3:28 PM

## 2016-11-15 NOTE — Progress Notes (Signed)
Discharge instructions given, questions answered, pt states understanding, signs and given copy 

## 2016-11-15 NOTE — MAU Note (Signed)
Last couple of days has been real itchy in vagina, seems kind of swollen also.

## 2016-11-28 ENCOUNTER — Inpatient Hospital Stay (HOSPITAL_COMMUNITY): Payer: Medicaid Other

## 2016-11-28 ENCOUNTER — Encounter (HOSPITAL_COMMUNITY): Payer: Self-pay | Admitting: *Deleted

## 2016-11-28 ENCOUNTER — Inpatient Hospital Stay (HOSPITAL_COMMUNITY)
Admission: AD | Admit: 2016-11-28 | Discharge: 2016-11-28 | Disposition: A | Discharge status: Home / Self Care | Payer: Medicaid Other | Source: Ambulatory Visit | Attending: Obstetrics and Gynecology | Admitting: Obstetrics and Gynecology

## 2016-11-28 DIAGNOSIS — Z3A24 24 weeks gestation of pregnancy: Secondary | ICD-10-CM | POA: Insufficient documentation

## 2016-11-28 DIAGNOSIS — O26892 Other specified pregnancy related conditions, second trimester: Secondary | ICD-10-CM | POA: Diagnosis not present

## 2016-11-28 DIAGNOSIS — R0602 Shortness of breath: Secondary | ICD-10-CM | POA: Insufficient documentation

## 2016-11-28 DIAGNOSIS — M549 Dorsalgia, unspecified: Secondary | ICD-10-CM | POA: Insufficient documentation

## 2016-11-28 DIAGNOSIS — Z8249 Family history of ischemic heart disease and other diseases of the circulatory system: Secondary | ICD-10-CM | POA: Insufficient documentation

## 2016-11-28 DIAGNOSIS — O9989 Other specified diseases and conditions complicating pregnancy, childbirth and the puerperium: Secondary | ICD-10-CM

## 2016-11-28 DIAGNOSIS — R05 Cough: Secondary | ICD-10-CM | POA: Diagnosis present

## 2016-11-28 DIAGNOSIS — M62838 Other muscle spasm: Secondary | ICD-10-CM

## 2016-11-28 MED ORDER — CYCLOBENZAPRINE HCL 5 MG PO TABS: 5.0000 mg | ORAL_TABLET | Freq: Three times a day (TID) | ORAL | 0 refills | Status: DC | PRN

## 2016-11-28 NOTE — MAU Note (Signed)
Pt sent over from office for a chest xray. Pt reports she has had a cough off and on for several months with back pain.. When her doctor listen to her lungs today she told her her breath sounds when uneven. Less on right side.

## 2016-11-28 NOTE — MAU Provider Note (Signed)
History     CSN: [id number]  Arrival date and time: 11/28/16 1457   None     Chief Complaint  Patient presents with  . Cough   Patient is a 21 year old G1 P0 at 24 weeks and 6 days who presents under the direction of her OB for shortness of breath. She reports she's been short of breath for about 1 month. She has had a cough over that time that is productive at times of white to yellow sputum. She denies any fevers or chills and denies rhinorrhea. She reports that shortness of breath is worse when she lays flat whether it is on her side or her back when she sitting up it improves. She does report pain in her back on the right side. Her pain is not worse with inspiration. She has no history of blood clot, no history of pneumonia or lung disease, no history of cardiac disease or heart failure. She is not a smoker.    OB History    Gravida Para Term Preterm AB Living   1 0 0 0 0 0   SAB TAB Ectopic Multiple Live Births   0 0 0 0        Past Medical History:  Diagnosis Date  . Medical history non-contributory     Past Surgical History:  Procedure Laterality Date  . NO PAST SURGERIES      Family History  Problem Relation Age of Onset  . Hypertension Mother   . Diabetes Mother     boderline  . Stroke Maternal Grandmother     Social History  Substance Use Topics  . Smoking status: Never Smoker  . Smokeless tobacco: Never Used  . Alcohol use No    Allergies: No Known Allergies  Prescriptions Prior to Admission  Medication Sig Dispense Refill Last Dose  . Prenatal Vit-Fe Fumarate-FA (PRENATAL VITAMINS) 28-0.8 MG TABS One a day (Patient taking differently: Take 2 tablets by mouth daily. ) 30 tablet 2 11/28/2016 at Unknown time  . metroNIDAZOLE (FLAGYL) 500 MG tablet Take 1 tablet (500 mg total) by mouth 2 (two) times daily. (Patient not taking: Reported on 11/28/2016) 14 tablet 0 Completed Course at Unknown time  . terconazole (TERAZOL 3) 0.8 % vaginal cream Place 1  applicator vaginally at bedtime. (Patient not taking: Reported on 11/28/2016) 20 g 0 Completed Course at Unknown time    Review of Systems  Constitutional: Negative for chills and fever.  HENT: Negative for congestion and rhinorrhea.   Respiratory: Positive for cough and shortness of breath. Negative for chest tightness and wheezing.   Cardiovascular: Negative for chest pain and palpitations.  Gastrointestinal: Negative for abdominal distention, abdominal pain, constipation, diarrhea, nausea and vomiting.  Genitourinary: Negative for difficulty urinating, enuresis, frequency and hematuria.  Musculoskeletal: Positive for back pain. Negative for myalgias.  Neurological: Negative for dizziness and weakness.   Physical Exam   Blood pressure 126/85, pulse (!) 101, temperature 99 F (37.2 C), temperature source Oral, resp. rate 18, height 5' 6.5" (1.689 m), weight 269 lb (122 kg), last menstrual period 06/27/2016, SpO2 97 %.  Physical Exam  Constitutional: She is oriented to person, place, and time. She appears well-developed and well-nourished.  HENT:  Head: Normocephalic and atraumatic.  Eyes: Conjunctivae are normal. Pupils are equal, round, and reactive to light.  Neck: Normal range of motion. Neck supple.  Cardiovascular: Normal rate, normal heart sounds and intact distal pulses.   No murmur heard. Respiratory: Effort normal. No respiratory  distress. She has no wheezes. She has no rales.  Decreased breath sounds on the right especially in the lower lung fields  GI: Soft. Bowel sounds are normal. She exhibits no distension. There is no tenderness. There is no rebound and no guarding.  Musculoskeletal: Normal range of motion. She exhibits no edema.  Lymphadenopathy:    She has no cervical adenopathy.  Neurological: She is alert and oriented to person, place, and time. No cranial nerve deficit.  Skin: Skin is warm and dry.  Psychiatric: She has a normal mood and affect. Her behavior is  normal.    MAU Course  Procedures  MDM In MA U patient underwent pulse oximetry which revealed pulse ox greater than 97% throughout her stay. She did have a chest x-ray which showed no pathology.  Pain was assessed and did appear to be reproducible think it may be related to the trapezius spasm and we'll treat with Flexeril.  NST was reviewed and reactive for gestational age baseline heart rate 150  Assessment and Plan  #1: Shortness of breath likely related to pregnancy chest x-ray and pulse oximetry normal #2: Upper back pain likely related to trapezius spasm Flexeril prescribed.  Ernestina Penna 11/28/2016, 3:57 PM

## 2017-01-11 ENCOUNTER — Encounter (HOSPITAL_COMMUNITY): Payer: Self-pay

## 2017-01-11 ENCOUNTER — Inpatient Hospital Stay (HOSPITAL_COMMUNITY)
Admission: AD | Admit: 2017-01-11 | Discharge: 2017-01-11 | Disposition: A | Discharge status: Home / Self Care | Payer: Medicaid Other | Source: Ambulatory Visit | Attending: Obstetrics and Gynecology | Admitting: Obstetrics and Gynecology

## 2017-01-11 DIAGNOSIS — Z3689 Encounter for other specified antenatal screening: Secondary | ICD-10-CM | POA: Diagnosis not present

## 2017-01-11 DIAGNOSIS — Z79899 Other long term (current) drug therapy: Secondary | ICD-10-CM | POA: Insufficient documentation

## 2017-01-11 DIAGNOSIS — O98813 Other maternal infectious and parasitic diseases complicating pregnancy, third trimester: Secondary | ICD-10-CM | POA: Diagnosis not present

## 2017-01-11 DIAGNOSIS — Z3A31 31 weeks gestation of pregnancy: Secondary | ICD-10-CM

## 2017-01-11 DIAGNOSIS — B3731 Acute candidiasis of vulva and vagina: Secondary | ICD-10-CM

## 2017-01-11 DIAGNOSIS — O9989 Other specified diseases and conditions complicating pregnancy, childbirth and the puerperium: Secondary | ICD-10-CM | POA: Diagnosis not present

## 2017-01-11 DIAGNOSIS — O4693 Antepartum hemorrhage, unspecified, third trimester: Secondary | ICD-10-CM | POA: Diagnosis present

## 2017-01-11 DIAGNOSIS — B373 Candidiasis of vulva and vagina: Secondary | ICD-10-CM | POA: Insufficient documentation

## 2017-01-11 LAB — URINALYSIS, ROUTINE W REFLEX MICROSCOPIC
Bilirubin Urine: NEGATIVE
GLUCOSE, UA: NEGATIVE mg/dL
Hgb urine dipstick: NEGATIVE
Ketones, ur: NEGATIVE mg/dL
Nitrite: NEGATIVE
PH: 5 (ref 5.0–8.0)
Protein, ur: 30 mg/dL — AB
SPECIFIC GRAVITY, URINE: 1.033 — AB (ref 1.005–1.030)

## 2017-01-11 LAB — WET PREP, GENITAL
CLUE CELLS WET PREP: NONE SEEN
Sperm: NONE SEEN
TRICH WET PREP: NONE SEEN
Yeast Wet Prep HPF POC: NONE SEEN

## 2017-01-11 MED ORDER — LIDOCAINE 4 % EX GEL: 1.0000 "application " | Freq: Three times a day (TID) | CUTANEOUS | 0 refills | Status: DC | PRN

## 2017-01-11 MED ORDER — FLUCONAZOLE 150 MG PO TABS: 150.0000 mg | ORAL_TABLET | Freq: Once | ORAL | 0 refills | Status: AC

## 2017-01-11 NOTE — MAU Note (Signed)
Pt c/o having a strange irritated feeling in her vagina for the last couple of days. Pt c/o vaginal spotting when she wipes today three times. Pt denies pain and leaking of fluid. Pt states baby is moving normally.

## 2017-01-11 NOTE — MAU Provider Note (Signed)
History     CSN: [id number]  Arrival date and time: 01/11/17 1706   First Provider Initiated Contact with Patient 01/11/17 1746      Chief Complaint  Patient presents with  . Vaginal Bleeding   G1 @31 .1 weeks here with vaginal itching and irritation x2 days. Denies vaginal discharge but had red spotting on the toilet paper earlier today. No LOF or ctx. Reports good FM.   OB History    Gravida Para Term Preterm AB Living   1 0 0 0 0 0   SAB TAB Ectopic Multiple Live Births   0 0 0 0        Past Medical History:  Diagnosis Date  . Medical history non-contributory     Past Surgical History:  Procedure Laterality Date  . NO PAST SURGERIES      Family History  Problem Relation Age of Onset  . Hypertension Mother   . Diabetes Mother        boderline  . Stroke Maternal Grandmother     Social History  Substance Use Topics  . Smoking status: Never Smoker  . Smokeless tobacco: Never Used  . Alcohol use No    Allergies: No Known Allergies  Prescriptions Prior to Admission  Medication Sig Dispense Refill Last Dose  . cyclobenzaprine (FLEXERIL) 5 MG tablet Take 1 tablet (5 mg total) by mouth 3 (three) times daily as needed for muscle spasms. 15 tablet 0   . Prenatal Vit-Fe Fumarate-FA (PRENATAL VITAMINS) 28-0.8 MG TABS One a day (Patient taking differently: Take 2 tablets by mouth daily. ) 30 tablet 2 11/28/2016 at Unknown time    Review of Systems  Gastrointestinal: Negative for abdominal pain.  Genitourinary: Positive for vaginal bleeding and vaginal pain. Negative for vaginal discharge.   Physical Exam   Blood pressure (!) 101/54, pulse 99, temperature 98.6 F (37 C), temperature source Oral, resp. rate 18, height 5' 6.5" (1.689 m), weight 123.3 kg (271 lb 12 oz), last menstrual period 06/27/2016, SpO2 99 %.  Physical Exam  Nursing note and vitals reviewed. Constitutional: She is oriented to person, place, and time. She appears well-developed and  well-nourished. No distress.  HENT:  Head: Normocephalic and atraumatic.  Neck: Normal range of motion.  Respiratory: Effort normal. No respiratory distress.  GI: Soft. She exhibits no distension. There is no tenderness.  gravid  Genitourinary:  Genitourinary Comments: External: erythematous, excoriated vulva, no lesions Vagina: rugated, pink, moist, moderate curdy white discharge Cervix closed/thick   Musculoskeletal: Normal range of motion.  Neurological: She is alert and oriented to person, place, and time.  Skin: Skin is warm and dry.  Psychiatric: She has a normal mood and affect.   EFM: 140 bpm, mod variability, + accels, no decels Toco: none  Results for orders placed or performed during the hospital encounter of 01/11/17 (from the past 24 hour(s))  Urinalysis, Routine w reflex microscopic     Status: Abnormal   Collection Time: 01/11/17  5:15 PM  Result Value Ref Range   Color, Urine AMBER (A) YELLOW   APPearance CLOUDY (A) CLEAR   Specific Gravity, Urine 1.033 (H) 1.005 - 1.030   pH 5.0 5.0 - 8.0   Glucose, UA NEGATIVE NEGATIVE mg/dL   Hgb urine dipstick NEGATIVE NEGATIVE   Bilirubin Urine NEGATIVE NEGATIVE   Ketones, ur NEGATIVE NEGATIVE mg/dL   Protein, ur 30 (A) NEGATIVE mg/dL   Nitrite NEGATIVE NEGATIVE   Leukocytes, UA LARGE (A) NEGATIVE   RBC / HPF  6-30 0 - 5 RBC/hpf   WBC, UA TOO NUMEROUS TO COUNT 0 - 5 WBC/hpf   Bacteria, UA FEW (A) NONE SEEN   Squamous Epithelial / LPF TOO NUMEROUS TO COUNT (A) NONE SEEN   Mucous PRESENT   Wet prep, genital     Status: Abnormal   Collection Time: 01/11/17  5:57 PM  Result Value Ref Range   Yeast Wet Prep HPF POC NONE SEEN NONE SEEN   Trich, Wet Prep NONE SEEN NONE SEEN   Clue Cells Wet Prep HPF POC NONE SEEN NONE SEEN   WBC, Wet Prep HPF POC FEW (A) NONE SEEN   Sperm NONE SEEN    MAU Course  Procedures  MDM Labs ordered and reviewed. Will treat yeast. Presentation, clinical findings, and plan discussed with Dr.  Henderson Cloud. Stable for discharge home.  Assessment and Plan   1. [redacted] weeks gestation of pregnancy   2. NST (non-stress test) reactive   3. Yeast vaginitis    Discharge home Follow up in office as scheduled next week Rx Diflucan Rx Lidocaine TID prn-apply externally Desitin OTC TID prn-apply externally  Allergies as of 01/11/2017   No Known Allergies     Medication List    TAKE these medications   cyclobenzaprine 5 MG tablet Commonly known as:  FLEXERIL Take 1 tablet (5 mg total) by mouth 3 (three) times daily as needed for muscle spasms.   fluconazole 150 MG tablet Commonly known as:  DIFLUCAN Take 1 tablet (150 mg total) by mouth once. Repeat dose on day 4   Lidocaine 4 % Gel Apply 1 application topically 3 (three) times daily as needed.   Prenatal Vitamins 28-0.8 MG Tabs One a day What changed:  how much to take  how to take this  when to take this  additional instructions      Donette Larry, CNM 01/11/2017, 6:43 PM

## 2017-01-11 NOTE — Progress Notes (Addendum)
G1@ 31.[redacted] wksga. Presents to triage for vaginal irritation and spot bleeding that started today. Back pain also that's tolerable 2/10. Denies LOF. + FM EFM applied. VSS see flow sheet for details.   1752: Provider at bs. Positioned pt for Wet prep CG, and pelvic exam.   1800: tranducer adjusted.  1845: transducer adjusted.   1914: Discharge instructions given with pt understanding. Pt left unit via ambulatory with family.

## 2017-01-11 NOTE — Discharge Instructions (Signed)

## 2017-01-13 LAB — OB RESULTS CONSOLE GC/CHLAMYDIA: Gonorrhea: NEGATIVE

## 2017-01-13 LAB — GC/CHLAMYDIA PROBE AMP (~~LOC~~) NOT AT ARMC
CHLAMYDIA, DNA PROBE: NEGATIVE
Neisseria Gonorrhea: NEGATIVE

## 2017-02-24 LAB — OB RESULTS CONSOLE GBS: GBS: NEGATIVE

## 2017-03-15 ENCOUNTER — Encounter (HOSPITAL_COMMUNITY): Payer: Self-pay

## 2017-03-15 ENCOUNTER — Inpatient Hospital Stay (HOSPITAL_COMMUNITY)
Admission: AD | Admit: 2017-03-15 | Discharge: 2017-03-19 | DRG: 775 | Disposition: A | Discharge status: Home / Self Care | Payer: Medicaid Other | Source: Ambulatory Visit | Attending: Obstetrics | Admitting: Obstetrics

## 2017-03-15 DIAGNOSIS — Z3A40 40 weeks gestation of pregnancy: Secondary | ICD-10-CM | POA: Diagnosis not present

## 2017-03-15 DIAGNOSIS — O26893 Other specified pregnancy related conditions, third trimester: Secondary | ICD-10-CM | POA: Diagnosis present

## 2017-03-15 DIAGNOSIS — Z6841 Body Mass Index (BMI) 40.0 and over, adult: Secondary | ICD-10-CM

## 2017-03-15 DIAGNOSIS — O99214 Obesity complicating childbirth: Principal | ICD-10-CM | POA: Diagnosis present

## 2017-03-15 DIAGNOSIS — O4202 Full-term premature rupture of membranes, onset of labor within 24 hours of rupture: Secondary | ICD-10-CM | POA: Diagnosis present

## 2017-03-15 LAB — CBC
HEMATOCRIT: 32.9 % — AB (ref 36.0–46.0)
Hemoglobin: 10.7 g/dL — ABNORMAL LOW (ref 12.0–15.0)
MCH: 25.1 pg — AB (ref 26.0–34.0)
MCHC: 32.5 g/dL (ref 30.0–36.0)
MCV: 77 fL — AB (ref 78.0–100.0)
Platelets: 337 10*3/uL (ref 150–400)
RBC: 4.27 MIL/uL (ref 3.87–5.11)
RDW: 16.2 % — AB (ref 11.5–15.5)
WBC: 8.6 10*3/uL (ref 4.0–10.5)

## 2017-03-15 LAB — TYPE AND SCREEN
ABO/RH(D): O POS
ANTIBODY SCREEN: NEGATIVE

## 2017-03-15 LAB — POCT FERN TEST: POCT FERN TEST: POSITIVE

## 2017-03-15 MED ORDER — OXYTOCIN 40 UNITS IN LACTATED RINGERS INFUSION - SIMPLE MED
2.5000 [IU]/h | INTRAVENOUS | Status: DC
  Administered 2017-03-17: 2.5 [IU]/h via INTRAVENOUS
  Filled 2017-03-15 (×2): qty 1000

## 2017-03-15 MED ORDER — LACTATED RINGERS IV SOLN
INTRAVENOUS | Status: DC
  Administered 2017-03-15 – 2017-03-17 (×4): via INTRAVENOUS

## 2017-03-15 MED ORDER — SOD CITRATE-CITRIC ACID 500-334 MG/5ML PO SOLN: 30.0000 mL | ORAL | Status: DC | PRN

## 2017-03-15 MED ORDER — ACETAMINOPHEN 325 MG PO TABS: 650.0000 mg | ORAL_TABLET | ORAL | Status: DC | PRN

## 2017-03-15 MED ORDER — ONDANSETRON HCL 4 MG/2ML IJ SOLN
4.0000 mg | Freq: Four times a day (QID) | INTRAMUSCULAR | Status: DC | PRN
  Administered 2017-03-16: 4 mg via INTRAVENOUS
  Filled 2017-03-15: qty 2

## 2017-03-15 MED ORDER — TERBUTALINE SULFATE 1 MG/ML IJ SOLN
0.2500 mg | Freq: Once | INTRAMUSCULAR | Status: DC | PRN
  Filled 2017-03-15: qty 1

## 2017-03-15 MED ORDER — ZOLPIDEM TARTRATE 5 MG PO TABS
5.0000 mg | ORAL_TABLET | Freq: Every evening | ORAL | Status: DC | PRN
  Administered 2017-03-16: 5 mg via ORAL
  Filled 2017-03-15: qty 1

## 2017-03-15 MED ORDER — LACTATED RINGERS IV SOLN: 500.0000 mL | INTRAVENOUS | Status: DC | PRN

## 2017-03-15 MED ORDER — LIDOCAINE HCL (PF) 1 % IJ SOLN
30.0000 mL | INTRAMUSCULAR | Status: DC | PRN
  Filled 2017-03-15: qty 30

## 2017-03-15 MED ORDER — MISOPROSTOL 25 MCG QUARTER TABLET
25.0000 ug | ORAL_TABLET | ORAL | Status: DC | PRN
Start: 2017-03-15 — End: 2017-03-17
  Administered 2017-03-15 – 2017-03-16 (×2): 25 ug via ORAL
  Filled 2017-03-15 (×4): qty 1

## 2017-03-15 MED ORDER — FENTANYL CITRATE (PF) 100 MCG/2ML IJ SOLN
50.0000 ug | INTRAMUSCULAR | Status: DC | PRN
  Administered 2017-03-16 (×2): 100 ug via INTRAVENOUS
  Filled 2017-03-15 (×2): qty 2

## 2017-03-15 MED ORDER — OXYCODONE-ACETAMINOPHEN 5-325 MG PO TABS
1.0000 | ORAL_TABLET | ORAL | Status: DC | PRN
Start: 2017-03-15 — End: 2017-03-17

## 2017-03-15 MED ORDER — OXYCODONE-ACETAMINOPHEN 5-325 MG PO TABS: 2.0000 | ORAL_TABLET | ORAL | Status: DC | PRN

## 2017-03-15 MED ORDER — OXYTOCIN BOLUS FROM INFUSION
500.0000 mL | Freq: Once | INTRAVENOUS | Status: AC
  Administered 2017-03-17: 500 mL via INTRAVENOUS

## 2017-03-15 NOTE — MAU Note (Addendum)
Was laying down and felt sharp pain in LLQ and pressure. THen fld started running down my legs. Clear fld. Cont to leak fld. No pain now. Leaking started about 1850

## 2017-03-15 NOTE — H&P (Signed)
21 y.o. G1P0000 @ 2871w1d presents with c/o LOF starting at [id number].  ROM confirmed by + fern.  Otherwise has good fetal movement and no bleeding.  She received two doses of cytotec overnight and is now on pitocin.  Reports inc contraction pain.   Past Medical History:  Diagnosis Date  . Medical history non-contributory     Past Surgical History:  Procedure Laterality Date  . NO PAST SURGERIES      OB History  Gravida Para Term Preterm AB Living  1 0 0 0 0 0  SAB TAB Ectopic Multiple Live Births  0 0 0 0      # Outcome Date GA Lbr Len/2nd Weight Sex Delivery Anes PTL Lv  1 Current               Social History   Social History  . Marital status: Single    Spouse name: N/A  . Number of children: 0  . Years of education: N/A   Occupational History  . Student     [employer]   Social History Main Topics  . Smoking status: Never Smoker  . Smokeless tobacco: Never Used  . Alcohol use No  . Drug use: No  . Sexual activity: Yes    Partners: Male    Birth control/ protection: None   Other Topics Concern  . Not on file   Social History Narrative  . No narrative on file   Patient has no known allergies.    Prenatal Transfer Tool  Maternal Diabetes: No Genetic Screening: Normal FTS Maternal Ultrasounds/Referrals: Normal Fetal Ultrasounds or other Referrals:  None Maternal Substance Abuse:  No Significant Maternal Medications:  Meds include: Zantac Other:  zofran Significant Maternal Lab Results: Lab values include: Group B Strep negative  ABO, Rh: --/--/O POS, O POS (08/11 2040) Antibody: NEG (08/11 2040) Rubella: Nonimmune (01/22 0000) RPR: Nonreactive (01/22 0000)  HBsAg: Negative (01/22 0000)  HIV: Non-reactive (01/22 0000)  GBS: Negative (07/23 0000)     Other PNC: uncomplicated.    Vitals:   03/16/17 0537 03/16/17 0747  BP: 122/77 130/78  Pulse: 89 90  Resp: 20 20  Temp: 98.3 F (36.8 C) 98 F (36.7 C)     General:  NAD Abdomen:  soft, gravid, EFW  8# Ex:  1+ edema SVE:  1.5/70/-2/vtx per RN FHTs:  130s, mod var Toco:  q2-3 min  US 7/26: EFW 6lb 3oz (50%)  A/P   21 y.o. G1P0000 6771w1d presents with ROM at term Continue IOL w pitocin.   Epidural upon request FSR/vtx/GBS neg  Heather Paul Hospital[facility]ANNA GEFFEL Chestine Paul

## 2017-03-15 NOTE — Anesthesia Pain Management Evaluation Note (Signed)
  CRNA Pain Management Visit Note  Patient: Heather Paul, 21 y.o., female  "Hello I am a [facility]. We have an anesthesia team available at all times to provide care throughout the hospital, including epidural management and anesthesia for C-section. I don't know your plan for the delivery whether it a natural birth, water birth, IV sedation, nitrous supplementation, doula or epidural, but we want to meet your pain goals."   1.Was your pain managed to your expectations on prior hospitalizations?   No prior hospitalizations  2.What is your expectation for pain management during this hospitalization?     Epidural  3.How can we help you reach that goal? epidural  Record the patient's initial score and the patient's pain goal.   Pain: 0  Pain Goal: 6 [facility] wants you to be able to say your pain was always managed very well.  Heather Paul 03/15/2017

## 2017-03-16 ENCOUNTER — Inpatient Hospital Stay (HOSPITAL_COMMUNITY): Payer: Medicaid Other | Admitting: Anesthesiology

## 2017-03-16 LAB — ABO/RH: ABO/RH(D): O POS

## 2017-03-16 LAB — RPR: RPR: NONREACTIVE

## 2017-03-16 MED ORDER — PHENYLEPHRINE 40 MCG/ML (10ML) SYRINGE FOR IV PUSH (FOR BLOOD PRESSURE SUPPORT)
80.0000 ug | PREFILLED_SYRINGE | INTRAVENOUS | Status: DC | PRN
  Filled 2017-03-16: qty 5

## 2017-03-16 MED ORDER — OXYTOCIN 40 UNITS IN LACTATED RINGERS INFUSION - SIMPLE MED
1.0000 m[IU]/min | INTRAVENOUS | Status: DC
  Administered 2017-03-16: 20 m[IU]/min via INTRAVENOUS
  Administered 2017-03-16: 16 m[IU]/min via INTRAVENOUS
  Administered 2017-03-16: 4 m[IU]/min via INTRAVENOUS
  Administered 2017-03-16: 6 m[IU]/min via INTRAVENOUS
  Administered 2017-03-16: 2 m[IU]/min via INTRAVENOUS
  Administered 2017-03-16: 6 m[IU]/min via INTRAVENOUS
  Administered 2017-03-16: 14 m[IU]/min via INTRAVENOUS
  Administered 2017-03-16: 12 m[IU]/min via INTRAVENOUS
  Administered 2017-03-16: 8 m[IU]/min via INTRAVENOUS
  Administered 2017-03-16: 18 m[IU]/min via INTRAVENOUS
  Administered 2017-03-16: 10 m[IU]/min via INTRAVENOUS

## 2017-03-16 MED ORDER — LACTATED RINGERS IV SOLN
500.0000 mL | Freq: Once | INTRAVENOUS | Status: AC
  Administered 2017-03-16: 500 mL via INTRAVENOUS

## 2017-03-16 MED ORDER — EPHEDRINE 5 MG/ML INJ
10.0000 mg | INTRAVENOUS | Status: DC | PRN
  Filled 2017-03-16: qty 2

## 2017-03-16 MED ORDER — LIDOCAINE HCL (PF) 1 % IJ SOLN
INTRAMUSCULAR | Status: DC | PRN
  Administered 2017-03-16 (×2): 5 mL

## 2017-03-16 MED ORDER — PHENYLEPHRINE 40 MCG/ML (10ML) SYRINGE FOR IV PUSH (FOR BLOOD PRESSURE SUPPORT)
80.0000 ug | PREFILLED_SYRINGE | INTRAVENOUS | Status: DC | PRN
  Filled 2017-03-16: qty 10
  Filled 2017-03-16: qty 5

## 2017-03-16 MED ORDER — DIPHENHYDRAMINE HCL 50 MG/ML IJ SOLN
12.5000 mg | INTRAMUSCULAR | Status: DC | PRN
  Administered 2017-03-16: 23:00:00 via INTRAVENOUS
  Administered 2017-03-16: 12.5 mg via INTRAVENOUS
  Filled 2017-03-16: qty 1

## 2017-03-16 MED ORDER — FENTANYL 2.5 MCG/ML BUPIVACAINE 1/10 % EPIDURAL INFUSION (WH - ANES)
14.0000 mL/h | INTRAMUSCULAR | Status: DC | PRN
  Administered 2017-03-16 – 2017-03-17 (×3): 14 mL/h via EPIDURAL
  Filled 2017-03-16 (×3): qty 100

## 2017-03-16 MED ORDER — TERBUTALINE SULFATE 1 MG/ML IJ SOLN
0.2500 mg | Freq: Once | INTRAMUSCULAR | Status: DC | PRN
  Filled 2017-03-16: qty 1

## 2017-03-16 NOTE — Anesthesia Preprocedure Evaluation (Signed)
Anesthesia Evaluation  Patient identified by MRN, date of birth, ID band Patient awake    Reviewed: Allergy & Precautions, H&P , NPO status , Patient's Chart, lab work & pertinent test results  History of Anesthesia Complications Negative for: history of anesthetic complications  Airway Mallampati: II  TM Distance: >3 FB Neck ROM: full    Dental no notable dental hx. (+) Teeth Intact   Pulmonary neg pulmonary ROS,    Pulmonary exam normal breath sounds clear to auscultation       Cardiovascular negative cardio ROS Normal cardiovascular exam Rhythm:regular Rate:Normal     Neuro/Psych negative neurological ROS  negative psych ROS   GI/Hepatic negative GI ROS, Neg liver ROS,   Endo/Other  Morbid obesity  Renal/GU negative Renal ROS  negative genitourinary   Musculoskeletal   Abdominal   Peds  Hematology negative hematology ROS (+)   Anesthesia Other Findings   Reproductive/Obstetrics (+) Pregnancy                             Anesthesia Physical Anesthesia Plan  ASA: III  Anesthesia Plan: Epidural   Post-op Pain Management:    Induction:   PONV Risk Score and Plan:   Airway Management Planned:   Additional Equipment:   Intra-op Plan:   Post-operative Plan:   Informed Consent: I have reviewed the patients History and Physical, chart, labs and discussed the procedure including the risks, benefits and alternatives for the proposed anesthesia with the patient or authorized representative who has indicated his/her understanding and acceptance.       Plan Discussed with:   Anesthesia Plan Comments:         Anesthesia Quick Evaluation  

## 2017-03-16 NOTE — Progress Notes (Signed)
Pt w some discomfort w cramping  BP 107/89 (BP Location: Left Arm)   Pulse 84   Temp 98.5 F (36.9 C) (Oral)   Resp 20   Ht 5\' 6"  (1.676 m)   Wt 124.7 kg (275 lb)   LMP 06/27/2016   BMI 44.39 kg/m   Toco: q3-4 min EFM: 120s, mod var, + accels, no decels SVE: 2/70/-2/posterior   A&P: g1 @ 5673w2d Cont pitocin Still in latent labor Fsr/vtx/gbs neg

## 2017-03-16 NOTE — Anesthesia Procedure Notes (Signed)
Epidural Patient location during procedure: OB  Staffing Anesthesiologist: Heidi Lemay Performed: anesthesiologist   Preanesthetic Checklist Completed: patient identified, site marked, surgical consent, pre-op evaluation, timeout performed, IV checked, risks and benefits discussed and monitors and equipment checked  Epidural Patient position: sitting Prep: DuraPrep Patient monitoring: heart rate, continuous pulse ox and blood pressure Approach: right paramedian Location: L3-L4 Injection technique: LOR saline  Needle:  Needle type: Tuohy  Needle gauge: 17 G Needle length: 9 cm and 9 Needle insertion depth: 6 cm Catheter type: closed end flexible Catheter size: 20 Guage Catheter at skin depth: 10 cm Test dose: negative  Assessment Events: blood not aspirated, injection not painful, no injection resistance, negative IV test and no paresthesia  Additional Notes Patient identified. Risks/Benefits/Options discussed with patient including but not limited to bleeding, infection, nerve damage, paralysis, failed block, incomplete pain control, headache, blood pressure changes, nausea, vomiting, reactions to medication both or allergic, itching and postpartum back pain. Confirmed with bedside nurse the patient's most recent platelet count. Confirmed with patient that they are not currently taking any anticoagulation, have any bleeding history or any family history of bleeding disorders. Patient expressed understanding and wished to proceed. All questions were answered. Sterile technique was used throughout the entire procedure. Please see nursing notes for vital signs. Test dose was given through epidural needle and negative prior to continuing to dose epidural or start infusion. Warning signs of high block given to the patient including shortness of breath, tingling/numbness in hands, complete motor block, or any concerning symptoms with instructions to call for help. Patient was given  instructions on fall risk and not to get out of bed. All questions and concerns addressed with instructions to call with any issues.     

## 2017-03-16 NOTE — Progress Notes (Signed)
Pt is comfortable w epidural. Is making slow cervical change--now 3cm.  Not yet in active labor.  Has been on pitocin for >12hrs and contractions now spacing on 24mU of pit.  Will rest off of pitocin x 1 hour then restart

## 2017-03-17 ENCOUNTER — Encounter (HOSPITAL_COMMUNITY): Payer: Self-pay | Admitting: *Deleted

## 2017-03-17 MED ORDER — ONDANSETRON HCL 4 MG PO TABS: 4.0000 mg | ORAL_TABLET | ORAL | Status: DC | PRN

## 2017-03-17 MED ORDER — COCONUT OIL OIL: 1.0000 "application " | TOPICAL_OIL | Status: DC | PRN

## 2017-03-17 MED ORDER — OXYCODONE-ACETAMINOPHEN 5-325 MG PO TABS: 1.0000 | ORAL_TABLET | ORAL | Status: DC | PRN

## 2017-03-17 MED ORDER — ZOLPIDEM TARTRATE 5 MG PO TABS: 5.0000 mg | ORAL_TABLET | Freq: Every evening | ORAL | Status: DC | PRN

## 2017-03-17 MED ORDER — IBUPROFEN 600 MG PO TABS
600.0000 mg | ORAL_TABLET | Freq: Four times a day (QID) | ORAL | Status: DC
  Administered 2017-03-17 – 2017-03-19 (×9): 600 mg via ORAL
  Filled 2017-03-17 (×8): qty 1

## 2017-03-17 MED ORDER — ACETAMINOPHEN 325 MG PO TABS
650.0000 mg | ORAL_TABLET | ORAL | Status: DC | PRN
Start: 2017-03-17 — End: 2017-03-19
  Administered 2017-03-18: 650 mg via ORAL
  Filled 2017-03-17: qty 2

## 2017-03-17 MED ORDER — SENNOSIDES-DOCUSATE SODIUM 8.6-50 MG PO TABS
2.0000 | ORAL_TABLET | ORAL | Status: DC
  Administered 2017-03-17 – 2017-03-18 (×2): 2 via ORAL
  Filled 2017-03-17 (×2): qty 2

## 2017-03-17 MED ORDER — WITCH HAZEL-GLYCERIN EX PADS: 1.0000 "application " | MEDICATED_PAD | CUTANEOUS | Status: DC | PRN

## 2017-03-17 MED ORDER — TETANUS-DIPHTH-ACELL PERTUSSIS 5-2.5-18.5 LF-MCG/0.5 IM SUSP: 0.5000 mL | Freq: Once | INTRAMUSCULAR | Status: DC

## 2017-03-17 MED ORDER — SIMETHICONE 80 MG PO CHEW: 80.0000 mg | CHEWABLE_TABLET | ORAL | Status: DC | PRN

## 2017-03-17 MED ORDER — BENZOCAINE-MENTHOL 20-0.5 % EX AERO
1.0000 "application " | INHALATION_SPRAY | CUTANEOUS | Status: DC | PRN
  Administered 2017-03-17: 1 via TOPICAL
  Filled 2017-03-17: qty 56

## 2017-03-17 MED ORDER — ONDANSETRON HCL 4 MG/2ML IJ SOLN: 4.0000 mg | INTRAMUSCULAR | Status: DC | PRN

## 2017-03-17 MED ORDER — OXYCODONE-ACETAMINOPHEN 5-325 MG PO TABS: 2.0000 | ORAL_TABLET | ORAL | Status: DC | PRN

## 2017-03-17 MED ORDER — DIBUCAINE 1 % RE OINT: 1.0000 "application " | TOPICAL_OINTMENT | RECTAL | Status: DC | PRN

## 2017-03-17 MED ORDER — METHYLERGONOVINE MALEATE 0.2 MG/ML IJ SOLN
0.2000 mg | Freq: Once | INTRAMUSCULAR | Status: AC
  Administered 2017-03-17: 0.2 mg via INTRAMUSCULAR

## 2017-03-17 MED ORDER — PRENATAL MULTIVITAMIN CH
1.0000 | ORAL_TABLET | Freq: Every day | ORAL | Status: DC
  Administered 2017-03-18 – 2017-03-19 (×2): 1 via ORAL
  Filled 2017-03-17: qty 1

## 2017-03-17 MED ORDER — DIPHENHYDRAMINE HCL 25 MG PO CAPS: 25.0000 mg | ORAL_CAPSULE | Freq: Four times a day (QID) | ORAL | Status: DC | PRN

## 2017-03-17 NOTE — Lactation Note (Signed)
This note was copied from a baby's chart. Lactation Consultation Note  Patient Name: Girl Theressa MillardKeisha Pignato ZOXWR'UToday's Date: 03/17/2017 Reason for consult: Initial assessment  RN also experiencing difficulty with bottle feeding infant & TcB at a high level (awaiting results of serum). RN shown how to use Special Needs Feeder to ensure infant able to feed overnight (longest line at infant's nose).    Lurline HareRichey, Ori Kreiter [facility] 03/17/2017, 11:55 PM

## 2017-03-17 NOTE — Lactation Note (Signed)
This note was copied from a baby's chart. Lactation Consultation Note  Patient Name: Heather Paul ZOXWR'UToday's Date: 03/17/2017 Reason for consult: Initial assessment  Initial visit at 11 hours of life. Infant not currently feeding well at breast, with cup, or with bottle. Mom w/intense discomfort at times when infant is breastfeeding despite what appears to be a "good" latch. Dimpling was noted at times when infant at breast. Tongue extension & cupping seem restricted. Infant does not suck well on finger or bottle nipple. "Heather Paul" seems to have a higher palate & she gags easily. Infant's ability at the breast & bottle as well as Mom's discomfort could suggest some posterior tongue restriction. A nipple shield (size 24) was attempted, but it seemed too cumbersome for Mom to use at this time. RN made aware of the above.  Mom reported no breast changes w/pregnancy other than soreness.  Heather Paul, Heather Paul [facility] 03/17/2017, 9:53 PM

## 2017-03-17 NOTE — Progress Notes (Signed)
Pt w some c/o pressure.  IUPC not tracing well.  BP (!) 110/58   Pulse (!) 58   Temp 98.2 F (36.8 C) (Oral)   Resp 18   Ht 5\' 6"  (1.676 m)   Wt 124.7 kg (275 lb)   LMP 06/27/2016   SpO2 99%   BMI 44.39 kg/m    Toco: q2-4 min EFM: 120s, mod var, + accels, no decels SVE: forebag ruptured--thin mec--7/90/-1, IUPC replaced  G1 @ 1740w3d w ROM Although she has been ruptured >24 hours, she remains afebrile with no s/sx of intrauterine infection Now in active labor Continue pitocin induction FSR/vtx/gbs neg

## 2017-03-17 NOTE — Anesthesia Postprocedure Evaluation (Signed)
Anesthesia Post Note  Patient: Heather Paul  Procedure(s) Performed: * No procedures listed *     Patient location during evaluation: Mother Baby Anesthesia Type: Epidural Level of consciousness: awake and alert and oriented Pain management: satisfactory to patient Vital Signs Assessment: post-procedure vital signs reviewed and stable Respiratory status: respiratory function stable, spontaneous breathing and nonlabored ventilation Cardiovascular status: stable Postop Assessment: no headache, no backache, epidural receding, patient able to bend at knees, no signs of nausea or vomiting and adequate PO intake Anesthetic complications: no    Last Vitals:  Vitals:   03/17/17 1320 03/17/17 1420  BP: 118/68 112/72  Pulse: 71 78  Resp: 16 20  Temp: 36.7 C 36.8 C  SpO2: 100%     Last Pain:  Vitals:   03/17/17 1420  TempSrc: Oral  PainSc: 6    Pain Goal: Patients Stated Pain Goal: 5 (03/17/17 1100)               Aydon Swamy

## 2017-03-18 LAB — CBC
HCT: 26.7 % — ABNORMAL LOW (ref 36.0–46.0)
Hemoglobin: 8.8 g/dL — ABNORMAL LOW (ref 12.0–15.0)
MCH: 25.4 pg — ABNORMAL LOW (ref 26.0–34.0)
MCHC: 33 g/dL (ref 30.0–36.0)
MCV: 77.2 fL — ABNORMAL LOW (ref 78.0–100.0)
PLATELETS: 268 10*3/uL (ref 150–400)
RBC: 3.46 MIL/uL — ABNORMAL LOW (ref 3.87–5.11)
RDW: 16.5 % — AB (ref 11.5–15.5)
WBC: 14.5 10*3/uL — AB (ref 4.0–10.5)

## 2017-03-18 NOTE — Progress Notes (Signed)
POD#1 Pt without c/o. States baby is not latching on well.  VSSAF IMP/ Stable Plan/ Routine care.

## 2017-03-18 NOTE — Progress Notes (Signed)
Pt called out to say she passed a clot. Pt sitting on toilet when walked into the room. Soft ball size clot noted on the floor. Small amount of blood noted in pad. Helped pt clean up and walked back to bed with no problem. Rechecked fundus. Fundus firm and midline. NO trickles or clots noted with fundal check.  Pt asymptomatic at this time. Pt educated and told to call if she passed anymore clots or become symptomatic.

## 2017-03-18 NOTE — Progress Notes (Signed)
CSW acknowledges consult for Edinburgh score of 17 and will meet with MOB tomorrow am to assess and offer support.  CSW notes baby's admission to NICU this afternoon.

## 2017-03-19 MED ORDER — DOCUSATE SODIUM 100 MG PO CAPS: 100.0000 mg | ORAL_CAPSULE | Freq: Two times a day (BID) | ORAL | 0 refills | Status: DC

## 2017-03-19 MED ORDER — MEASLES, MUMPS & RUBELLA VAC ~~LOC~~ INJ
0.5000 mL | INJECTION | Freq: Once | SUBCUTANEOUS | Status: AC
  Administered 2017-03-19: 0.5 mL via SUBCUTANEOUS
  Filled 2017-03-19 (×2): qty 0.5

## 2017-03-19 MED ORDER — OXYCODONE-ACETAMINOPHEN 5-325 MG PO TABS: 1.0000 | ORAL_TABLET | ORAL | 0 refills | Status: DC | PRN

## 2017-03-19 MED ORDER — IBUPROFEN 600 MG PO TABS: 600.0000 mg | ORAL_TABLET | Freq: Four times a day (QID) | ORAL | 0 refills | Status: DC | PRN

## 2017-03-19 NOTE — Progress Notes (Signed)
CSW received consult due to score greater than 9, or positive for SI on [place] Depression Screen.  Per bedside RN, MOB scored 17 on [place] Depression Screen.   CSW met with MOB and FOB in MOB's first floor room/121 to offer support.  MOB reports that she is "exhausted," and "sad" because, "I just want to go home."  She understands need for baby to remain in the hospital due to high bilirubin level, but wishes they were on their way home.  CSW validated her feelings.  MOB also mentioned that she felt unprepared for what things would be like after delivery and started thinking about how some girls are alone when going through labor, delivery, and postpartum.  She states she felt sad thinking about this and that she cannot imagine going through this alone.  CSW helped her process this and turn this thought into a strength of having her boyfriend here to support her.  CSW also normalized wandering thoughts, and discussed mindfulness.  CSW notes concern when people are not able to be mindful and return their thoughts to reality.    CSW provided education regarding Baby Blues vs PMADs and provided MOB with information about support groups [facility].  CSW encouraged MOB to evaluate her mental health throughout the postpartum period with the use of the New Mom Checklist developed by Postpartum Progress and notify a medical professional if symptoms arise.  MOB states no hx of mental illness.  Parents were attentive and seemed appreciative of CSW's concern for their emotional wellbeing.  Parents were attentive to baby while CSW was in the room.  MOB kept her hand on baby's bassinet while we spoke, although baby was on phototherapy and therefore CSW was not able to see MOB interact much with baby.  Bonding appears evident in the way MOB looked at her daughter and in the way FOB adjusted baby's goggles when she wiggled out of them.  CSW identifies no further concerns or barriers to discharge. 

## 2017-03-20 ENCOUNTER — Ambulatory Visit: Payer: Self-pay

## 2017-03-20 NOTE — Lactation Note (Signed)
This note was copied from a baby's chart. Lactation Consultation Note Noted mom was only giving formula. Mom told RN she was only giving formula.  LC went to see mom, mom was sleeping. Patient Name: Heather Paul HQION'GToday's Date: 03/20/2017 Reason for consult: Follow-up assessment   Maternal Data    Feeding    LATCH Score                   Interventions    Lactation Tools Discussed/Used     Consult Status Consult Status: Complete Date: 03/20/17    Charyl DancerCARVER, [name] 03/20/2017, 3:22 AM

## 2017-04-08 NOTE — Discharge Summary (Signed)
Obstetric Discharge Summary Reason for Admission: rupture of membranes Prenatal Procedures: NST and ultrasound Intrapartum Procedures: spontaneous vaginal delivery Postpartum Procedures: none Complications-Operative and Postpartum: vaginal laceration Hemoglobin  Date Value Ref Range Status  03/18/2017 8.8 (L) 12.0 - 15.0 g/dL Final   HCT  Date Value Ref Range Status  03/18/2017 26.7 (L) 36.0 - 46.0 % Final    Physical Exam:  General: alert, cooperative and appears stated age 51Lochia: appropriate Uterine Fundus: firm Incision: healing well DVT Evaluation: No evidence of DVT seen on physical exam.  Discharge Diagnoses: Term Pregnancy-delivered  Discharge Information: Date: 04/08/2017 Activity: pelvic rest Diet: routine Medications: Ibuprofen, Colace and Percocet Condition: improved Instructions: refer to practice specific booklet Discharge to: home   Newborn Data: Live born female  Birth Weight: 7 lb 8.6 oz (3419 g) APGAR: 9, 9  Home with mother.  Heather Worrell H. 04/08/2017, 5:49 PM

## 2017-09-29 ENCOUNTER — Emergency Department (HOSPITAL_COMMUNITY)
Admission: EM | Admit: 2017-09-29 | Discharge: 2017-09-29 | Disposition: A | Discharge status: Home / Self Care | Payer: Medicaid Other | Attending: Emergency Medicine | Admitting: Emergency Medicine

## 2017-09-29 ENCOUNTER — Other Ambulatory Visit: Payer: Self-pay

## 2017-09-29 ENCOUNTER — Encounter (HOSPITAL_COMMUNITY): Payer: Self-pay

## 2017-09-29 DIAGNOSIS — N898 Other specified noninflammatory disorders of vagina: Secondary | ICD-10-CM | POA: Diagnosis present

## 2017-09-29 DIAGNOSIS — N760 Acute vaginitis: Secondary | ICD-10-CM | POA: Insufficient documentation

## 2017-09-29 DIAGNOSIS — Z79899 Other long term (current) drug therapy: Secondary | ICD-10-CM | POA: Diagnosis not present

## 2017-09-29 DIAGNOSIS — B9689 Other specified bacterial agents as the cause of diseases classified elsewhere: Secondary | ICD-10-CM

## 2017-09-29 LAB — WET PREP, GENITAL
Sperm: NONE SEEN
Trich, Wet Prep: NONE SEEN
Yeast Wet Prep HPF POC: NONE SEEN

## 2017-09-29 MED ORDER — METRONIDAZOLE 500 MG PO TABS: 500.0000 mg | ORAL_TABLET | Freq: Two times a day (BID) | ORAL | 0 refills | Status: DC

## 2017-09-29 NOTE — Discharge Instructions (Addendum)
Please take Flagyl twice a day for one week Do not drink alcohol while taking this medication You will be called with any abnormal results

## 2017-09-29 NOTE — ED Triage Notes (Signed)
Pt presents to the ed with complaints of vaginal discharge that is white x 2 weeks. Denies any abdominal pain or n/v

## 2017-09-29 NOTE — ED Provider Notes (Signed)
[facility] EMERGENCY DEPARTMENT Provider Note   CSN: [id number] Arrival date & time: 09/29/17  1047     History   Chief Complaint Chief Complaint  Patient presents with  . Vaginal Discharge    HPI Heather Paul is a 22 y.o. female who presents with vaginal discharge. She states that she's had white vaginal discharge for 1-2 weeks. She reports associated vaginal itching.  The patient states "I think I have a yeast infection". She has had them before and feels similar. She gave birth approximately 6 months ago and did have frequent yeast infections at that time. She's also had BV but states she's never had itching with this. She denies recent sexual activity. She denies recent antibiotic use or hx of diabetes. She denies fever, chills, N/V/D, urinary symptoms.  HPI  Past Medical History:  Diagnosis Date  . Medical history non-contributory     Patient Active Problem List   Diagnosis Date Noted  . Full-term premature rupture of membranes with onset of labor within 24 hours of rupture 03/15/2017    Past Surgical History:  Procedure Laterality Date  . NO PAST SURGERIES      OB History    Gravida Para Term Preterm AB Living   1 1 1  0 0 1   SAB TAB Ectopic Multiple Live Births   0 0 0 0 1       Home Medications    Prior to Admission medications   Medication Sig Start Date End Date Taking? Authorizing Provider  docusate sodium (COLACE) 100 MG capsule Take 1 capsule (100 mg total) by mouth 2 (two) times daily. 03/19/17   Waynard Reeds, MD  ibuprofen (ADVIL,MOTRIN) 600 MG tablet Take 1 tablet (600 mg total) by mouth every 6 (six) hours as needed. 03/19/17   Waynard Reeds, MD  oxyCODONE-acetaminophen (ROXICET) 5-325 MG tablet Take 1-2 tablets by mouth every 4 (four) hours as needed for severe pain. 03/19/17   Waynard Reeds, MD  Prenatal MV-Min-FA-Omega-3 (PRENATAL GUMMIES/DHA & FA) 0.4-32.5 MG CHEW Chew 2 each by mouth daily.    [provider]     Family History Family History  Problem Relation Age of Onset  . Hypertension Mother   . Diabetes Mother        boderline  . Stroke Maternal Grandmother     Social History Social History   Tobacco Use  . Smoking status: Never Smoker  . Smokeless tobacco: Never Used  Substance Use Topics  . Alcohol use: No  . Drug use: No     Allergies   Sulfa antibiotics   Review of Systems Review of Systems  Constitutional: Negative for fever.  Gastrointestinal: Negative for abdominal pain, diarrhea, nausea and vomiting.  Genitourinary: Positive for vaginal discharge. Negative for dysuria, flank pain, pelvic pain, vaginal bleeding and vaginal pain.  All other systems reviewed and are negative.    Physical Exam Updated Vital Signs BP 140/72   Pulse 68   Temp 98.8 F (37.1 C) (Oral)   Resp 17   LMP 09/21/2017   SpO2 100%   Physical Exam  Constitutional: She is oriented to person, place, and time. She appears well-developed and well-nourished. No distress.  Obese, calm, cooperative  HENT:  Head: Normocephalic and atraumatic.  Eyes: Conjunctivae are normal. Pupils are equal, round, and reactive to light. Right eye exhibits no discharge. Left eye exhibits no discharge. No scleral icterus.  Neck: Normal range of motion.  Cardiovascular: Normal rate.  Pulmonary/Chest: Effort normal.  No respiratory distress.  Abdominal: She exhibits no distension.  Genitourinary:  Genitourinary Comments: Pelvic: No inguinal lymphadenopathy or inguinal hernia noted. Normal external genitalia. No pain with speculum insertion. Closed cervical os with normal appearance - no rash or lesions. White discharge in vaginal vault.  Chaperone present during exam.    Neurological: She is alert and oriented to person, place, and time.  Skin: Skin is warm and dry.  Psychiatric: She has a normal mood and affect. Her behavior is normal.  Nursing note and vitals reviewed.    ED Treatments / Results   Labs (all labs ordered are listed, but only abnormal results are displayed) Labs Reviewed  WET PREP, GENITAL - Abnormal; Notable for the following components:      Result Value   Clue Cells Wet Prep HPF POC PRESENT (*)    WBC, Wet Prep HPF POC MANY (*)    All other components within normal limits  GC[facility]LAMYDIA [facility] AMP [facility]    EKG  EKG Interpretation None       Radiology No results found.  Procedures Procedures (including critical care time)  Medications Ordered in ED Medications - No data to display   Initial Impression / Assessment and Plan / ED Course  I have reviewed the triage vital signs and the nursing notes.  Pertinent labs & imaging results that were available during my care of the patient were reviewed by me and considered in my medical decision making (see chart for details).  22 year old female with vaginal discharge. Vitals are normal. Abdomen is non-tender. Pelvic is unremarkable other than small amount of white discharge. Wet prep shows present clue cells, many WBCs, however no yeast. Will treat for BV with Flagyl for one week. G&C was sent. Return precautions given.  Final Clinical Impressions(s) / ED Diagnoses   Final diagnoses:  Vaginal discharge  Bacterial vaginosis    ED Discharge Orders    None       Bethel BornGekas, Samad Thon Marie, PA-C 09/29/17 1553    Linwood DibblesKnapp, Jon, MD 09/30/17 1535

## 2017-09-30 LAB — GC/CHLAMYDIA PROBE AMP (~~LOC~~) NOT AT ARMC
CHLAMYDIA, DNA PROBE: NEGATIVE
Neisseria Gonorrhea: NEGATIVE

## 2018-03-23 ENCOUNTER — Encounter (HOSPITAL_COMMUNITY): Payer: Self-pay

## 2018-03-23 ENCOUNTER — Other Ambulatory Visit: Payer: Self-pay

## 2018-03-23 ENCOUNTER — Emergency Department (HOSPITAL_COMMUNITY)
Admission: EM | Admit: 2018-03-23 | Discharge: 2018-03-23 | Disposition: A | Discharge status: Home / Self Care | Payer: Medicaid Other | Attending: Emergency Medicine | Admitting: Emergency Medicine

## 2018-03-23 DIAGNOSIS — B373 Candidiasis of vulva and vagina: Secondary | ICD-10-CM | POA: Insufficient documentation

## 2018-03-23 DIAGNOSIS — B3731 Acute candidiasis of vulva and vagina: Secondary | ICD-10-CM

## 2018-03-23 LAB — WET PREP, GENITAL
Clue Cells Wet Prep HPF POC: NONE SEEN
Sperm: NONE SEEN
Trich, Wet Prep: NONE SEEN
Yeast Wet Prep HPF POC: NONE SEEN

## 2018-03-23 LAB — URINALYSIS, ROUTINE W REFLEX MICROSCOPIC
BILIRUBIN URINE: NEGATIVE
GLUCOSE, UA: NEGATIVE mg/dL
Hgb urine dipstick: NEGATIVE
KETONES UR: NEGATIVE mg/dL
Nitrite: NEGATIVE
PH: 5 (ref 5.0–8.0)
PROTEIN: NEGATIVE mg/dL
Specific Gravity, Urine: 1.027 (ref 1.005–1.030)

## 2018-03-23 LAB — POC URINE PREG, ED: PREG TEST UR: NEGATIVE

## 2018-03-23 MED ORDER — FLUCONAZOLE 150 MG PO TABS
150.0000 mg | ORAL_TABLET | Freq: Once | ORAL | Status: AC
  Administered 2018-03-23: 150 mg via ORAL
  Filled 2018-03-23: qty 1

## 2018-03-23 NOTE — ED Provider Notes (Signed)
[facility]   CSN: [id number] Arrival date & time: 03/23/18  1412     History   Chief Complaint Chief Complaint  Patient presents with  . Vaginal Itching    HPI Heather Paul is a 22 y.o. female.  Pt presents to the ED today with vaginal itching and discharge.  The pt said sx have been going on for 3-4 days.  She used monistat, but it caused vaginal burning, so she stopped.     Past Medical History:  Diagnosis Date  . Medical history non-contributory     Patient Active Problem List   Diagnosis Date Noted  . Full-term premature rupture of membranes with onset of labor within 24 hours of rupture 03/15/2017    Past Surgical History:  Procedure Laterality Date  . NO PAST SURGERIES       OB History    Gravida  1   Para  1   Term  1   Preterm  0   AB  0   Living  1     SAB  0   TAB  0   Ectopic  0   Multiple  0   Live Births  1            Home Medications    Prior to Admission medications   Medication Sig Start Date End Date Taking? Authorizing Provider  docusate sodium (COLACE) 100 MG capsule Take 1 capsule (100 mg total) by mouth 2 (two) times daily. Patient not taking: Reported on 03/23/2018 03/19/17   Waynard Reedsoss, Kendra, MD  ibuprofen (ADVIL,MOTRIN) 600 MG tablet Take 1 tablet (600 mg total) by mouth every 6 (six) hours as needed. Patient not taking: Reported on 03/23/2018 03/19/17   Waynard Reedsoss, Kendra, MD  metroNIDAZOLE (FLAGYL) 500 MG tablet Take 1 tablet (500 mg total) by mouth 2 (two) times daily. Patient not taking: Reported on 03/23/2018 09/29/17   Bethel BornGekas, Kelly Marie, PA-C  oxyCODONE-acetaminophen (ROXICET) 5-325 MG tablet Take 1-2 tablets by mouth every 4 (four) hours as needed for severe pain. Patient not taking: Reported on 03/23/2018 03/19/17   Waynard Reedsoss, Kendra, MD    Family History Family History  Problem Relation Age of Onset  . Hypertension Mother   . Diabetes Mother        boderline  .  Stroke Maternal Grandmother     Social History Social History   Tobacco Use  . Smoking status: Never Smoker  . Smokeless tobacco: Never Used  Substance Use Topics  . Alcohol use: No  . Drug use: No     Allergies   Sulfa antibiotics   Review of Systems Review of Systems  Genitourinary: Positive for vaginal discharge.  All other systems reviewed and are negative.    Physical Exam Updated Vital Signs BP 138/82 (BP Location: Right Arm)   Pulse (!) 56   Temp 98.9 F (37.2 C) (Oral)   Resp 16   LMP 02/22/2018 (Approximate)   SpO2 100%   Physical Exam  Constitutional: She is oriented to person, place, and time. She appears well-developed and well-nourished.  HENT:  Head: Normocephalic and atraumatic.  Right Ear: External ear normal.  Left Ear: External ear normal.  Nose: Nose normal.  Mouth/Throat: Oropharynx is clear and moist.  Eyes: Pupils are equal, round, and reactive to light. Conjunctivae and EOM are normal.  Neck: Normal range of motion. Neck supple.  Cardiovascular: Normal rate, regular rhythm, normal heart sounds and intact distal pulses.  Pulmonary/Chest: Effort normal and breath sounds normal.  Abdominal: Soft. Bowel sounds are normal.  Genitourinary: Cervix exhibits no discharge. Right adnexum displays no tenderness. Left adnexum displays no tenderness. Vaginal discharge found.  Neurological: She is alert and oriented to person, place, and time.  Skin: Skin is warm. Capillary refill takes less than 2 seconds.  Psychiatric: She has a normal mood and affect. Her behavior is normal. Judgment and thought content normal.  Nursing note and vitals reviewed.    ED Treatments / Results  Labs (all labs ordered are listed, but only abnormal results are displayed) Labs Reviewed  WET PREP, GENITAL - Abnormal; Notable for the following components:      Result Value   WBC, Wet Prep HPF POC FEW (*)    All other components within normal limits  URINALYSIS,  ROUTINE W REFLEX MICROSCOPIC - Abnormal; Notable for the following components:   APPearance CLOUDY (*)    Leukocytes, UA TRACE (*)    Bacteria, UA RARE (*)    All other components within normal limits  POC URINE PREG, ED  GC/CHLAMYDIA PROBE AMP ([place]    EKG None  Radiology No results found.  Procedures Procedures (including critical care time)  Medications Ordered in ED Medications  fluconazole (DIFLUCAN) tablet 150 mg (has no administration in time range)     Initial Impression / Assessment and Plan / ED Course  I have reviewed the triage vital signs and the nursing notes.  Pertinent labs & imaging results that were available during my care of the patient were reviewed by me and considered in my medical decision making (see chart for details).     Pt does have whitish d/c with itching, so although the wet prep did not pick up yeast, I will treat her with diflucan.  Return if worse.  F/u with gyn.  Final Clinical Impressions(s) / ED Diagnoses   Final diagnoses:  Vaginal yeast infection    ED Discharge Orders    None       Jacalyn LefevreHaviland, Amerie Beaumont, MD 03/23/18 [phone]

## 2018-03-23 NOTE — ED Triage Notes (Signed)
Pt presents for evaluation of vaginal itching x 3-4 days. Pt denies dysuria, vaginal discharge or vaginal bleeding.

## 2018-03-24 LAB — GC/CHLAMYDIA PROBE AMP (~~LOC~~) NOT AT ARMC
Chlamydia: NEGATIVE
Neisseria Gonorrhea: NEGATIVE

## 2018-12-02 IMAGING — US US OB COMP LESS 14 WK
1 series · 14 of 28 positions shown · non-contrast
Comparison: None.

CLINICAL DATA: 20-year-old female with several weeks of pain,
nausea vomiting. Estimated gestational age by LMP 6 weeks and 4
days. Initial encounter.

EXAM:
OBSTETRIC <14 WK US AND TRANSVAGINAL OB US
TECHNIQUE: Both transabdominal and transvaginal ultrasound examinations were
performed for complete evaluation of the gestation as well as the
maternal uterus, adnexal regions, and pelvic cul-de-sac.
Transvaginal technique was performed to assess early pregnancy.

[Series 1: us ob comp less 14 wk · 0.25mm/px · 41 acquisitions, 14 frames shown]
[im 2/41]
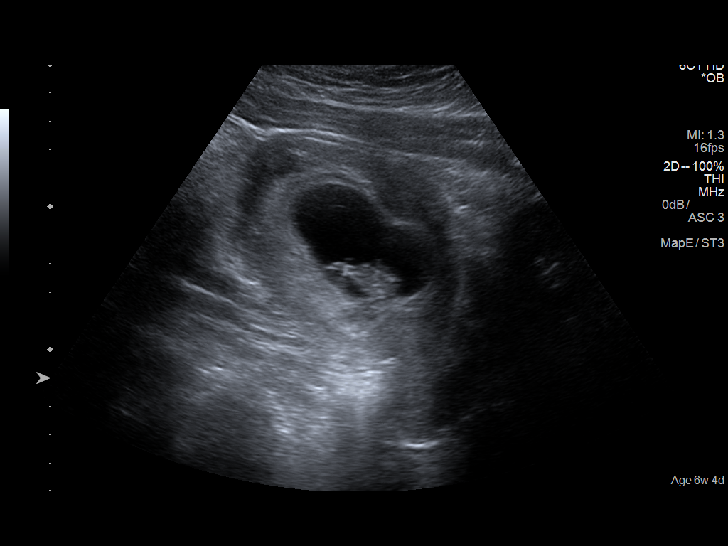
[im 5/41]
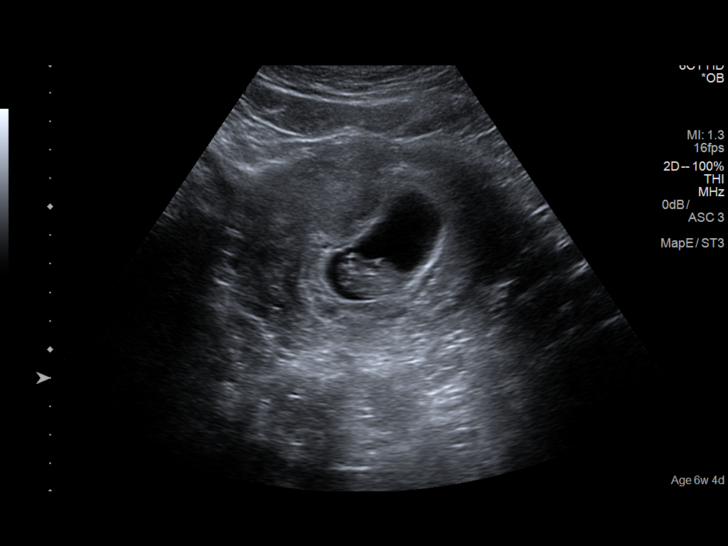
[im 8/41]
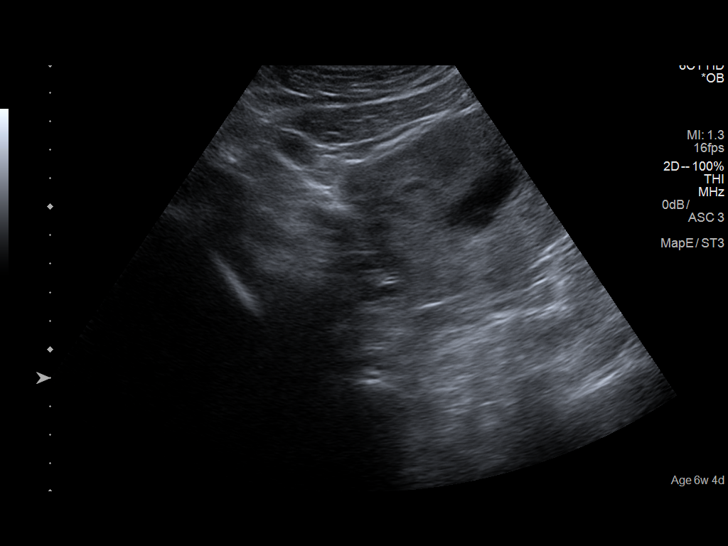
[im 11/41]
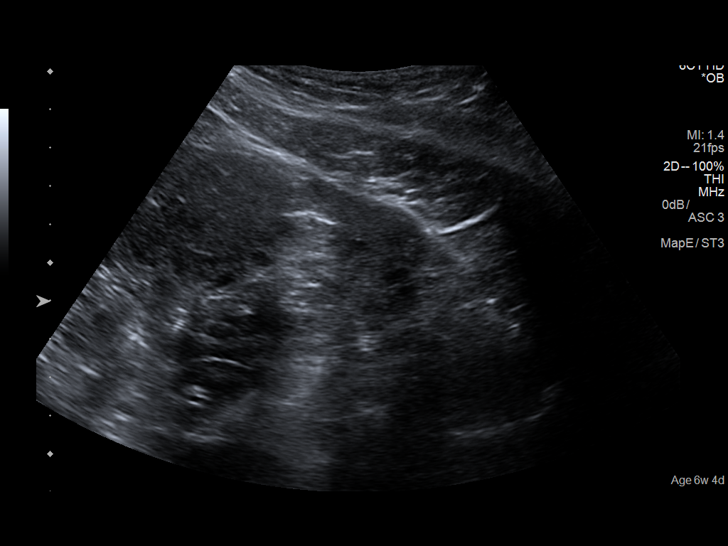
[im 14/41]
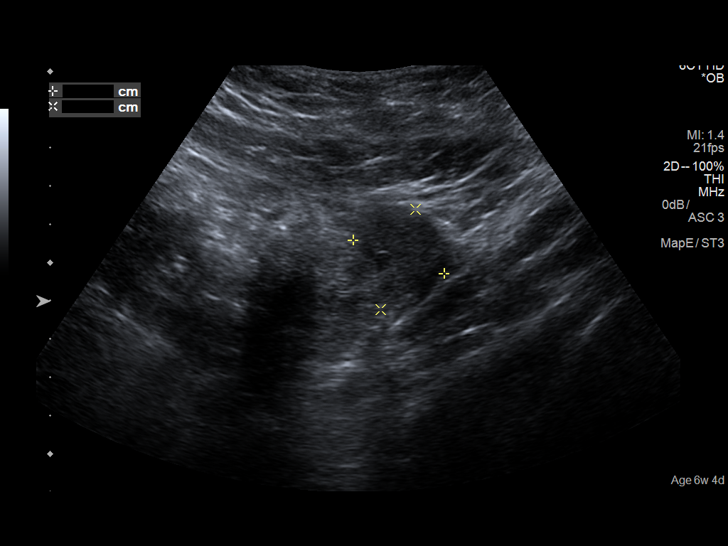
[im 17/41]
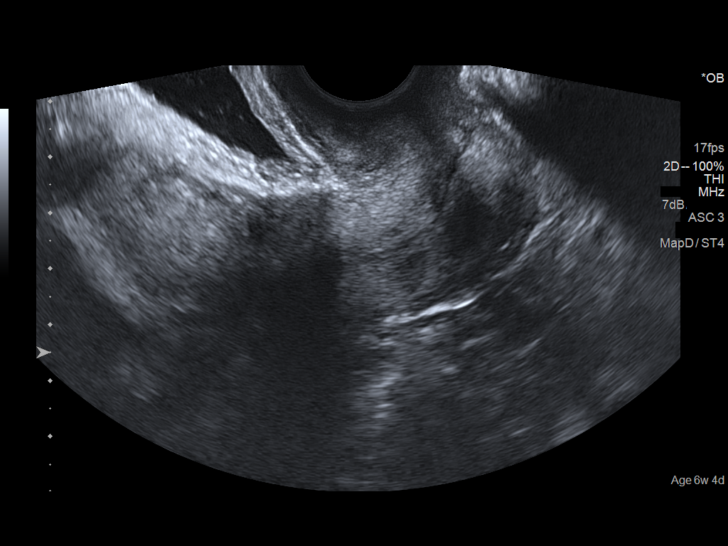
[im 20/41]
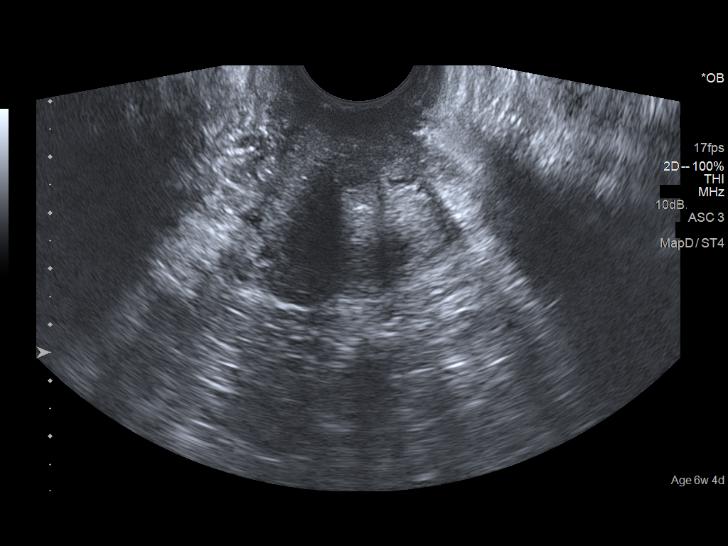
[im 23/41]
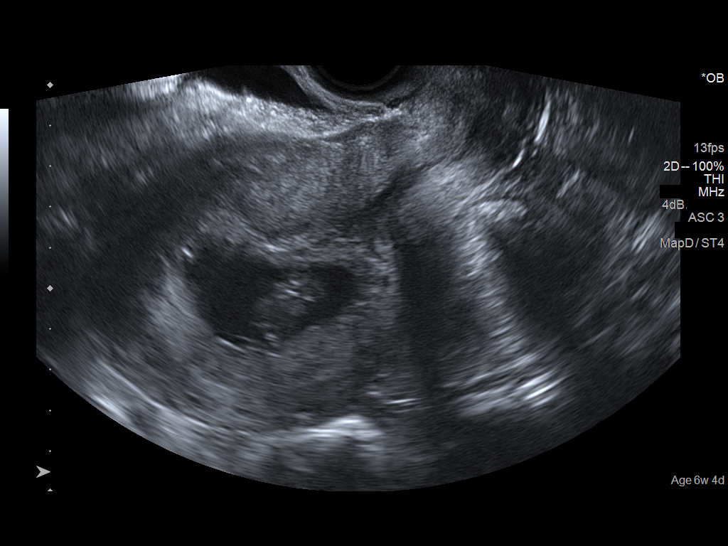
[im 26/41]
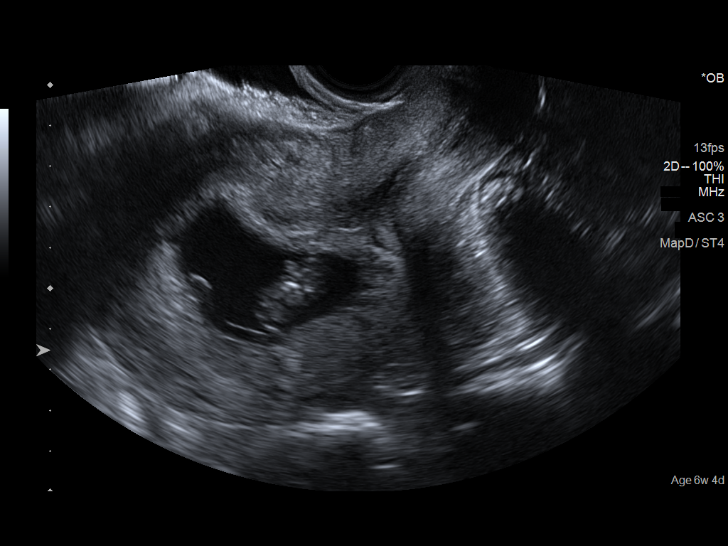
[im 29/41]
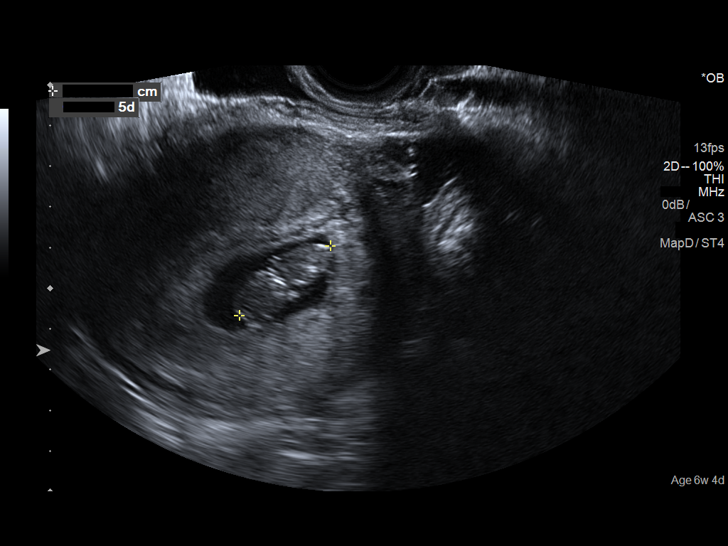
[im 32/41]
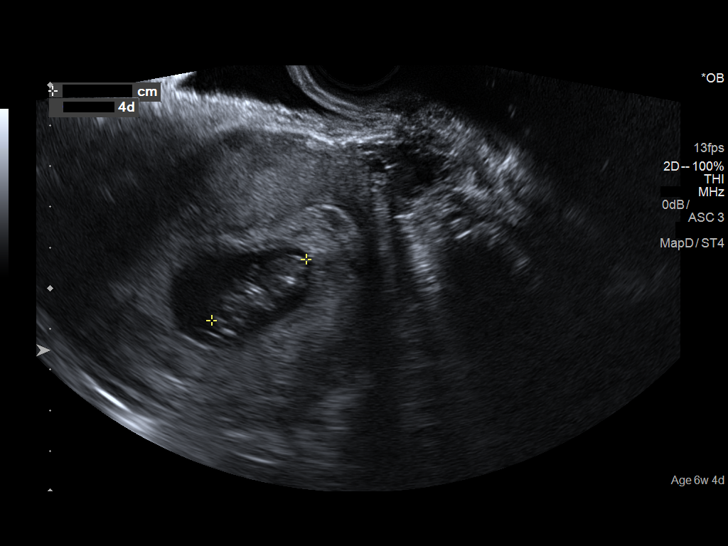
[im 35/41]
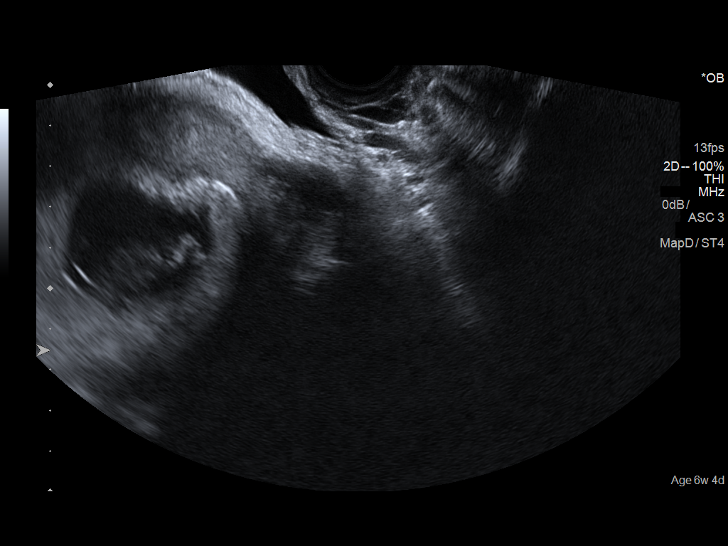
[im 38/41]
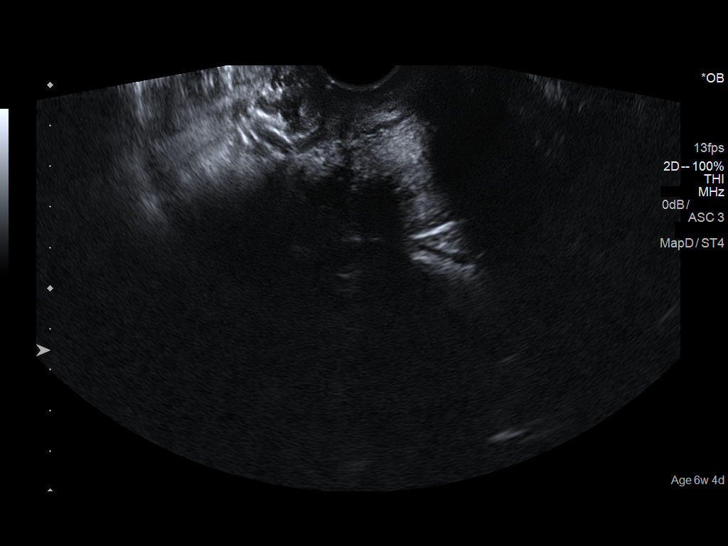
[im 41/41]
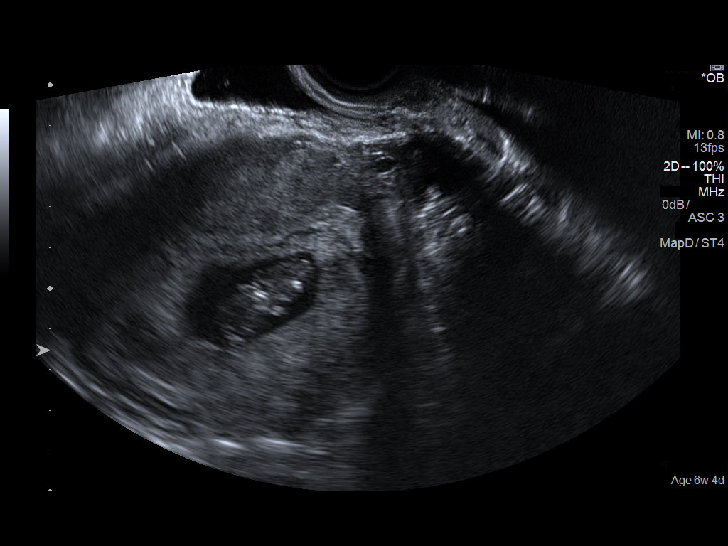

[14 of 28 positions shown; findings below may reference images not displayed]

FINDINGS: Intrauterine gestational sac: Single

Yolk sac:  Visible

Embryo:  Visible

Cardiac Activity: Detected

Heart Rate: 175  bpm

CRL:  28.2  mm   9 w   5 d                  US EDC: 03/14/2017

Subchorionic hemorrhage:  None visualized.

Maternal uterus/adnexae: The right ovary could not be visualized.
The left ovary is visible by transabdominal technique and appears
normal measuring 2.6 x 2.5 x 2.8 cm. No pelvic free fluid.
IMPRESSION: Single living IUP demonstrated. No acute maternal findings
visualized.

## 2019-03-07 ENCOUNTER — Emergency Department (HOSPITAL_COMMUNITY)
Admission: EM | Admit: 2019-03-07 | Discharge: 2019-03-07 | Disposition: A | Discharge status: Home / Self Care | Payer: Self-pay | Attending: Emergency Medicine | Admitting: Emergency Medicine

## 2019-03-07 ENCOUNTER — Emergency Department (HOSPITAL_COMMUNITY): Payer: Self-pay

## 2019-03-07 ENCOUNTER — Other Ambulatory Visit: Payer: Self-pay

## 2019-03-07 ENCOUNTER — Encounter (HOSPITAL_COMMUNITY): Payer: Self-pay | Admitting: Emergency Medicine

## 2019-03-07 DIAGNOSIS — K0889 Other specified disorders of teeth and supporting structures: Secondary | ICD-10-CM

## 2019-03-07 DIAGNOSIS — K047 Periapical abscess without sinus: Secondary | ICD-10-CM | POA: Insufficient documentation

## 2019-03-07 LAB — BASIC METABOLIC PANEL
Anion gap: 10 (ref 5–15)
BUN: 7 mg/dL (ref 6–20)
CO2: 22 mmol/L (ref 22–32)
Calcium: 8.8 mg/dL — ABNORMAL LOW (ref 8.9–10.3)
Chloride: 105 mmol/L (ref 98–111)
Creatinine, Ser: 0.69 mg/dL (ref 0.44–1.00)
GFR calc Af Amer: >60 mL/min (ref >60)
GFR calc non Af Amer: >60 mL/min (ref >60)
Glucose, Bld: 94 mg/dL (ref 70–99)
Potassium: 3.3 mmol/L — ABNORMAL LOW (ref 3.5–5.1)
Sodium: 137 mmol/L (ref 135–145)

## 2019-03-07 LAB — CBC WITH DIFFERENTIAL/PLATELET
Abs Immature Granulocytes: 0.03 10*3/uL (ref 0.00–0.07)
Basophils Absolute: 0.1 10*3/uL (ref 0.0–0.1)
Basophils Relative: 1 %
Eosinophils Absolute: 0.1 10*3/uL (ref 0.0–0.5)
Eosinophils Relative: 1 %
HCT: 36.5 % (ref 36.0–46.0)
Hemoglobin: 11.8 g/dL — ABNORMAL LOW (ref 12.0–15.0)
Immature Granulocytes: 0 %
Lymphocytes Relative: 34 %
Lymphs Abs: 2.5 10*3/uL (ref 0.7–4.0)
MCH: 26.2 pg (ref 26.0–34.0)
MCHC: 32.3 g/dL (ref 30.0–36.0)
MCV: 80.9 fL (ref 80.0–100.0)
Monocytes Absolute: 0.4 10*3/uL (ref 0.1–1.0)
Monocytes Relative: 6 %
Neutro Abs: 4.3 10*3/uL (ref 1.7–7.7)
Neutrophils Relative %: 58 %
Platelets: 342 10*3/uL (ref 150–400)
RBC: 4.51 MIL/uL (ref 3.87–5.11)
RDW: 14.9 % (ref 11.5–15.5)
WBC: 7.3 10*3/uL (ref 4.0–10.5)
nRBC: 0 % (ref 0.0–0.2)

## 2019-03-07 LAB — I-STAT BETA HCG BLOOD, ED (MC, WL, AP ONLY): I-stat hCG, quantitative: <5 m[IU]/mL (ref <5)

## 2019-03-07 MED ORDER — PENICILLIN V POTASSIUM 500 MG PO TABS: 500.0000 mg | ORAL_TABLET | Freq: Four times a day (QID) | ORAL | 0 refills | Status: AC

## 2019-03-07 MED ORDER — NAPROXEN 500 MG PO TABS: 500.0000 mg | ORAL_TABLET | Freq: Two times a day (BID) | ORAL | 0 refills | Status: DC

## 2019-03-07 MED ORDER — LIDOCAINE VISCOUS HCL 2 % MT SOLN: 15.0000 mL | OROMUCOSAL | 0 refills | Status: DC | PRN

## 2019-03-07 MED ORDER — PENICILLIN V POTASSIUM 250 MG PO TABS
500.0000 mg | ORAL_TABLET | Freq: Once | ORAL | Status: AC
  Administered 2019-03-07: 22:00:00 500 mg via ORAL
  Filled 2019-03-07: qty 2

## 2019-03-07 MED ORDER — IOHEXOL 300 MG/ML  SOLN
75.0000 mL | Freq: Once | INTRAMUSCULAR | Status: AC | PRN
  Administered 2019-03-07: 23:00:00 75 mL via INTRAVENOUS

## 2019-03-07 MED ORDER — LIDOCAINE VISCOUS HCL 2 % MT SOLN
15.0000 mL | Freq: Once | OROMUCOSAL | Status: AC
  Administered 2019-03-07: 15 mL via OROMUCOSAL
  Filled 2019-03-07: qty 15

## 2019-03-07 MED ORDER — KETOROLAC TROMETHAMINE 15 MG/ML IJ SOLN
15.0000 mg | Freq: Once | INTRAMUSCULAR | Status: AC
  Administered 2019-03-07: 22:00:00 15 mg via INTRAVENOUS
  Filled 2019-03-07: qty 1

## 2019-03-07 NOTE — ED Notes (Signed)
Complaints left lower gum pain that has been ongoing for 3 days. Pt reports some swelling to her cheek. No blood or drainage noted.   Pt has used salt gargles, peroxide gargles, 800 mg Ibuprofen, and Clindamycin to help. Pain is relieved for short periods and then comes back.

## 2019-03-07 NOTE — ED Notes (Signed)
Patient transported to CT 

## 2019-03-07 NOTE — ED Notes (Signed)
Patient verbalizes understanding of discharge instructions. Opportunity for questioning and answers were provided. Armband removed by staff, pt discharged from ED.  

## 2019-03-07 NOTE — Discharge Instructions (Addendum)
Take antibiotics as prescribed.  Take entire course, even if your symptoms improve. Take naproxen 2 times a day with meals.  Do not take other anti-inflammatories at the same time (Advil, Motrin, naproxen, Aleve). You may supplement with Tylenol if you need further pain control. Use viscous lidocaine for further pain control. Follow-up with a dentist.  There is information about this in the paperwork. Return to the emergency room with any new, worsening, concerning symptoms.

## 2019-03-07 NOTE — ED Provider Notes (Signed)
[facility] Provider Note   CSN: [id number] Arrival date & time: 03/07/19  1942     History   Chief Complaint Chief Complaint  Patient presents with  . Dental Pain    HPI Heather Paul is a 23 y.o. female presenting for evaluation of dental pain.  Patient states for the past 3 days, she has been having dental pain.  Is of the left lower tooth.  Pain is gradually worsening.  She is not taken anything for this.  She reports subjective hot and cold flashes, but no known fever.  She has a history of problems with this tooth, has not followed up with dentistry.  She denies difficulty swallowing or difficulty handling her secretions.  She has no other medical problems, takes no medications daily.     HPI  Past Medical History:  Diagnosis Date  . Medical history non-contributory     Patient Active Problem List   Diagnosis Date Noted  . Full-term premature rupture of membranes with onset of labor within 24 hours of rupture 03/15/2017    Past Surgical History:  Procedure Laterality Date  . NO PAST SURGERIES       OB History    Gravida  1   Para  1   Term  1   Preterm  0   AB  0   Living  1     SAB  0   TAB  0   Ectopic  0   Multiple  0   Live Births  1            Home Medications    Prior to Admission medications   Medication Sig Start Date End Date Taking? Authorizing Provider  docusate sodium (COLACE) 100 MG capsule Take 1 capsule (100 mg total) by mouth 2 (two) times daily. Patient not taking: Reported on 03/23/2018 03/19/17   Waynard Reedsoss, Kendra, MD  ibuprofen (ADVIL,MOTRIN) 600 MG tablet Take 1 tablet (600 mg total) by mouth every 6 (six) hours as needed. Patient not taking: Reported on 03/23/2018 03/19/17   Waynard Reedsoss, Kendra, MD  lidocaine (XYLOCAINE) 2 % solution Use as directed 15 mLs in the mouth or throat as needed for mouth pain. 03/07/19   Lisseth Brazeau, PA-C  metroNIDAZOLE (FLAGYL) 500 MG tablet Take 1 tablet  (500 mg total) by mouth 2 (two) times daily. Patient not taking: Reported on 03/23/2018 09/29/17   Bethel BornGekas, Kelly Marie, PA-C  naproxen (NAPROSYN) 500 MG tablet Take 1 tablet (500 mg total) by mouth 2 (two) times daily with a meal. 03/07/19   Viyan Rosamond, PA-C  oxyCODONE-acetaminophen (ROXICET) 5-325 MG tablet Take 1-2 tablets by mouth every 4 (four) hours as needed for severe pain. Patient not taking: Reported on 03/23/2018 03/19/17   Waynard Reedsoss, Kendra, MD  penicillin v potassium (VEETID) 500 MG tablet Take 1 tablet (500 mg total) by mouth 4 (four) times daily for 7 days. 03/07/19 03/14/19  Alyxandria Wentz, PA-C    Family History Family History  Problem Relation Age of Onset  . Hypertension Mother   . Diabetes Mother        boderline  . Stroke Maternal Grandmother     Social History Social History   Tobacco Use  . Smoking status: Never Smoker  . Smokeless tobacco: Never Used  Substance Use Topics  . Alcohol use: No  . Drug use: No     Allergies   Sulfa antibiotics   Review of Systems Review of Systems  HENT:  Positive for dental problem.   Respiratory: Negative for cough.      Physical Exam Updated Vital Signs BP (!) 145/81 (BP Location: Left Arm)   Pulse 60   Temp 98.8 F (37.1 C) (Oral)   Resp 18   LMP 03/07/2019   SpO2 100%   Physical Exam Vitals signs and nursing note reviewed.  Constitutional:      General: She is not in acute distress.    Appearance: She is well-developed.  HENT:     Head: Normocephalic and atraumatic.     Mouth/Throat:      Comments: Overall poor dentition.  Cracked left lower tooth which is tender to palpation.  Tenderness palpation surrounding teeth and gum.  No pain under the tongue.  Handling secretions well. Significant tenderness to palpation under the left mandible and upper neck. Neck:     Musculoskeletal: Normal range of motion.  Cardiovascular:     Rate and Rhythm: Normal rate and regular rhythm.     Pulses: Normal pulses.   Pulmonary:     Effort: Pulmonary effort is normal.     Breath sounds: Normal breath sounds.  Abdominal:     General: There is no distension.  Musculoskeletal: Normal range of motion.  Skin:    General: Skin is warm.     Findings: No rash.  Neurological:     Mental Status: She is alert and oriented to person, place, and time.      ED Treatments / Results  Labs (all labs ordered are listed, but only abnormal results are displayed) Labs Reviewed  CBC WITH DIFFERENTIAL/PLATELET - Abnormal; Notable for the following components:      Result Value   Hemoglobin 11.8 (*)    All other components within normal limits  BASIC METABOLIC PANEL - Abnormal; Notable for the following components:   Potassium 3.3 (*)    Calcium 8.8 (*)    All other components within normal limits  I-STAT BETA HCG BLOOD, ED ([facility], [facility], [facility] ONLY)    EKG None  Radiology Ct Soft Tissue Neck W Contrast  Result Date: 03/07/2019 CLINICAL DATA:  Lower left oral pain for 3 days with facial swelling EXAM: CT NECK WITH CONTRAST TECHNIQUE: Multidetector CT imaging of the neck was performed using the standard protocol following the bolus administration of intravenous contrast. CONTRAST:  58mL OMNIPAQUE IOHEXOL 300 MG/ML  SOLN COMPARISON:  None. FINDINGS: PHARYNX AND LARYNX: --Nasopharynx: Fossae of Rosenmuller are clear. Normal adenoid tonsils for age. --Oral cavity and oropharynx: There is a large dental carie of tooth 18 with a intermediate sized periapical lucency. No associated abscess or induration of the subcutaneous soft tissues. --Hypopharynx: Normal vallecula and pyriform sinuses. --Larynx: Normal epiglottis and pre-epiglottic space. Normal aryepiglottic and vocal folds. --Retropharyngeal space: No abscess, effusion or lymphadenopathy. SALIVARY GLANDS: --Parotid: No mass lesion or inflammation. No sialolithiasis or ductal dilatation. --Submandibular: Symmetric without inflammation. No sialolithiasis or ductal dilatation.  --Sublingual: Normal. No ranula or other visible lesion of the base of tongue and floor of mouth. THYROID: Normal. LYMPH NODES: No enlarged or abnormal density lymph nodes. VASCULAR: Retropharyngeal course of the left internal carotid artery. LIMITED INTRACRANIAL: Normal. VISUALIZED ORBITS: Normal. MASTOIDS AND VISUALIZED PARANASAL SINUSES: No fluid levels or advanced mucosal thickening. No mastoid effusion. SKELETON: No bony spinal canal stenosis. No lytic or blastic lesions. UPPER CHEST: Clear. OTHER: None. IMPRESSION: Large dental carie and intermediate sized periapical lucency at the second most posterior remaining left mandibular tooth (probably tooth 18, if tooth 16  has been extracted). No subperiosteal abscess or fluid collection. Electronically Signed   By: Deatra RobinsonKevin  Herman M.D.   On: 03/07/2019 22:51    Procedures Procedures (including critical care time)  Medications Ordered in ED Medications  ketorolac (TORADOL) 15 MG/ML injection 15 mg (15 mg Intravenous Given 03/07/19 2141)  lidocaine (XYLOCAINE) 2 % viscous mouth solution 15 mL (15 mLs Mouth/Throat Given 03/07/19 2131)  penicillin v potassium (VEETID) tablet 500 mg (500 mg Oral Given 03/07/19 2131)  iohexol (OMNIPAQUE) 300 MG/ML solution 75 mL (75 mLs Intravenous Contrast Given 03/07/19 2230)     Initial Impression / Assessment and Plan / ED Course  I have reviewed the triage vital signs and the nursing notes.  Pertinent labs & imaging results that were available during my care of the patient were reviewed by me and considered in my medical decision making (see chart for details).        Patient presenting for evaluation of dental pain.  Physical examination, she appears nontoxic.  Cracked tooth of the left lower jaw with surrounding tenderness.  Patient with significant tenderness under her left jaw/mandible.  Concern for extending infection/early Ludwig's, though clinically is not obvious.  As such, will obtain labs and CT.  Labs  reassuring, no leukocytosis.  CT without extending infection.  Does show dental/periapical infection.  On reassessment, pt states sxs improved with medications. Will treat OP with antibiotics and symptomatic pain control.  Patient given resources for dentistry in the area.  At this time, patient appears safe for discharge.  Return precautions given.  Patient states she understands and agrees to plan.  Final Clinical Impressions(s) / ED Diagnoses   Final diagnoses:  Pain, dental  Dental infection    ED Discharge Orders         Ordered    penicillin v potassium (VEETID) 500 MG tablet  4 times daily     03/07/19 2258    lidocaine (XYLOCAINE) 2 % solution  As needed     03/07/19 2258    naproxen (NAPROSYN) 500 MG tablet  2 times daily with meals     03/07/19 2258           Calene Paradiso, PA-C 03/07/19 2347    Pricilla LovelessGoldston, Scott, MD 03/15/19 [phone]

## 2019-06-09 ENCOUNTER — Other Ambulatory Visit: Payer: Self-pay

## 2019-06-09 ENCOUNTER — Emergency Department (HOSPITAL_COMMUNITY)
Admission: EM | Admit: 2019-06-09 | Discharge: 2019-06-10 | Disposition: A | Discharge status: Home / Self Care | Payer: Medicaid Other | Attending: Emergency Medicine | Admitting: Emergency Medicine

## 2019-06-09 ENCOUNTER — Encounter (HOSPITAL_COMMUNITY): Payer: Self-pay | Admitting: Emergency Medicine

## 2019-06-09 DIAGNOSIS — R110 Nausea: Secondary | ICD-10-CM | POA: Insufficient documentation

## 2019-06-09 DIAGNOSIS — Z3A01 Less than 8 weeks gestation of pregnancy: Secondary | ICD-10-CM | POA: Insufficient documentation

## 2019-06-09 DIAGNOSIS — Z3201 Encounter for pregnancy test, result positive: Secondary | ICD-10-CM | POA: Diagnosis not present

## 2019-06-09 LAB — COMPREHENSIVE METABOLIC PANEL
ALT: 12 U/L (ref 0–44)
AST: 14 U/L — ABNORMAL LOW (ref 15–41)
Albumin: 3.7 g/dL (ref 3.5–5.0)
Alkaline Phosphatase: 51 U/L (ref 38–126)
Anion gap: 10 (ref 5–15)
BUN: 7 mg/dL (ref 6–20)
CO2: 22 mmol/L (ref 22–32)
Calcium: 8.8 mg/dL — ABNORMAL LOW (ref 8.9–10.3)
Chloride: 104 mmol/L (ref 98–111)
Creatinine, Ser: 0.69 mg/dL (ref 0.44–1.00)
GFR calc Af Amer: >60 mL/min (ref >60)
GFR calc non Af Amer: >60 mL/min (ref >60)
Glucose, Bld: 89 mg/dL (ref 70–99)
Potassium: 3.3 mmol/L — ABNORMAL LOW (ref 3.5–5.1)
Sodium: 136 mmol/L (ref 135–145)
Total Bilirubin: 0.5 mg/dL (ref 0.3–1.2)
Total Protein: 6.8 g/dL (ref 6.5–8.1)

## 2019-06-09 LAB — URINALYSIS, ROUTINE W REFLEX MICROSCOPIC
Bilirubin Urine: NEGATIVE
Glucose, UA: NEGATIVE mg/dL
Hgb urine dipstick: NEGATIVE
Ketones, ur: NEGATIVE mg/dL
Nitrite: NEGATIVE
Protein, ur: 30 mg/dL — AB
Specific Gravity, Urine: 1.032 — ABNORMAL HIGH (ref 1.005–1.030)
pH: 5 (ref 5.0–8.0)

## 2019-06-09 LAB — CBC
HCT: 34.2 % — ABNORMAL LOW (ref 36.0–46.0)
Hemoglobin: 11.4 g/dL — ABNORMAL LOW (ref 12.0–15.0)
MCH: 27.2 pg (ref 26.0–34.0)
MCHC: 33.3 g/dL (ref 30.0–36.0)
MCV: 81.6 fL (ref 80.0–100.0)
Platelets: 361 10*3/uL (ref 150–400)
RBC: 4.19 MIL/uL (ref 3.87–5.11)
RDW: 14.9 % (ref 11.5–15.5)
WBC: 7.5 10*3/uL (ref 4.0–10.5)
nRBC: 0 % (ref 0.0–0.2)

## 2019-06-09 LAB — I-STAT BETA HCG BLOOD, ED (MC, WL, AP ONLY): I-stat hCG, quantitative: >2000 m[IU]/mL — ABNORMAL HIGH (ref <5)

## 2019-06-09 LAB — LIPASE, BLOOD: Lipase: 32 U/L (ref 11–51)

## 2019-06-09 MED ORDER — SODIUM CHLORIDE 0.9% FLUSH: 3.0000 mL | Freq: Once | INTRAVENOUS | Status: DC

## 2019-06-09 NOTE — ED Triage Notes (Signed)
Patient reports intermittent for 2 weeks , no diarrhea , denies fever or chills . No abdominal pain .

## 2019-06-10 MED ORDER — DOXYLAMINE-PYRIDOXINE 10-10 MG PO TBEC: 2.0000 | DELAYED_RELEASE_TABLET | Freq: Every day | ORAL | 0 refills | Status: DC

## 2019-06-10 MED ORDER — PRENATAL COMPLETE 14-0.4 MG PO TABS: 1.0000 | ORAL_TABLET | Freq: Every day | ORAL | 0 refills | Status: DC

## 2019-06-10 NOTE — ED Notes (Signed)
Patient verbalizes understanding of discharge instructions. Opportunity for questioning and answers were provided. Armband removed by staff, pt discharged from ED.  

## 2019-06-10 NOTE — ED Provider Notes (Signed)
[facility] Provider Note   CSN: [id number] Arrival date & time: 06/09/19  2001     History   Chief Complaint Chief Complaint  Patient presents with  . Emesis    HPI Heather MillardKeisha Paschen is a 23 y.o. female.     The history is provided by the patient and medical records.  Emesis    23 year old G2, P1 at unknown gestation, presenting to the ED with nausea.  States she took 2 home pregnancy test over the weekend that were both positive.  Her last menstrual period was end of September 9/23 or 9/26 (not entirely sure).  States periods are irregular but did not have one at all in October.  She denies any abdominal pain, pelvic pain, vaginal discharge, or irregular bleeding.  She does report intermittent nausea, mostly in the morning, heightened sense of smell, and breast tenderness.  She not had any nipple discharge.  She has not yet established care with OB/GYN.  She has not started prenatals.  Past Medical History:  Diagnosis Date  . Medical history non-contributory     Patient Active Problem List   Diagnosis Date Noted  . Full-term premature rupture of membranes with onset of labor within 24 hours of rupture 03/15/2017    Past Surgical History:  Procedure Laterality Date  . NO PAST SURGERIES       OB History    Gravida  1   Para  1   Term  1   Preterm  0   AB  0   Living  1     SAB  0   TAB  0   Ectopic  0   Multiple  0   Live Births  1            Home Medications    Prior to Admission medications   Medication Sig Start Date End Date Taking? Authorizing Provider  docusate sodium (COLACE) 100 MG capsule Take 1 capsule (100 mg total) by mouth 2 (two) times daily. Patient not taking: Reported on 03/23/2018 03/19/17   Waynard Reedsoss, Kendra, MD  ibuprofen (ADVIL,MOTRIN) 600 MG tablet Take 1 tablet (600 mg total) by mouth every 6 (six) hours as needed. Patient not taking: Reported on 03/23/2018 03/19/17   Waynard Reedsoss, Kendra, MD   lidocaine (XYLOCAINE) 2 % solution Use as directed 15 mLs in the mouth or throat as needed for mouth pain. 03/07/19   Caccavale, Sophia, PA-C  metroNIDAZOLE (FLAGYL) 500 MG tablet Take 1 tablet (500 mg total) by mouth 2 (two) times daily. Patient not taking: Reported on 03/23/2018 09/29/17   Bethel BornGekas, Kelly Marie, PA-C  naproxen (NAPROSYN) 500 MG tablet Take 1 tablet (500 mg total) by mouth 2 (two) times daily with a meal. 03/07/19   Caccavale, Sophia, PA-C  oxyCODONE-acetaminophen (ROXICET) 5-325 MG tablet Take 1-2 tablets by mouth every 4 (four) hours as needed for severe pain. Patient not taking: Reported on 03/23/2018 03/19/17   Waynard Reedsoss, Kendra, MD    Family History Family History  Problem Relation Age of Onset  . Hypertension Mother   . Diabetes Mother        boderline  . Stroke Maternal Grandmother     Social History Social History   Tobacco Use  . Smoking status: Never Smoker  . Smokeless tobacco: Never Used  Substance Use Topics  . Alcohol use: No  . Drug use: No     Allergies   Sulfa antibiotics   Review of Systems Review of  Systems  Gastrointestinal: Positive for nausea.  All other systems reviewed and are negative.    Physical Exam Updated Vital Signs BP 110/64 (BP Location: Left Arm)   Pulse 73   Temp 98.8 F (37.1 C) (Oral)   Resp 14   LMP 04/20/2019 (Approximate)   SpO2 100%   Physical Exam Vitals signs and nursing note reviewed.  Constitutional:      Appearance: She is well-developed.     Comments: Eating Doritos upon entering room  HENT:     Head: Normocephalic and atraumatic.  Eyes:     Conjunctiva/sclera: Conjunctivae normal.     Pupils: Pupils are equal, round, and reactive to light.  Neck:     Musculoskeletal: Normal range of motion.  Cardiovascular:     Rate and Rhythm: Normal rate and regular rhythm.     Heart sounds: Normal heart sounds.  Pulmonary:     Effort: Pulmonary effort is normal.     Breath sounds: Normal breath sounds.   Abdominal:     General: Bowel sounds are normal.     Palpations: Abdomen is soft.  Musculoskeletal: Normal range of motion.  Skin:    General: Skin is warm and dry.  Neurological:     Mental Status: She is alert and oriented to person, place, and time.      ED Treatments / Results  Labs (all labs ordered are listed, but only abnormal results are displayed) Labs Reviewed  COMPREHENSIVE METABOLIC PANEL - Abnormal; Notable for the following components:      Result Value   Potassium 3.3 (*)    Calcium 8.8 (*)    AST 14 (*)    All other components within normal limits  CBC - Abnormal; Notable for the following components:   Hemoglobin 11.4 (*)    HCT 34.2 (*)    All other components within normal limits  URINALYSIS, ROUTINE W REFLEX MICROSCOPIC - Abnormal; Notable for the following components:   APPearance CLOUDY (*)    Specific Gravity, Urine 1.032 (*)    Protein, ur 30 (*)    Leukocytes,Ua SMALL (*)    Bacteria, UA RARE (*)    All other components within normal limits  I-STAT BETA HCG BLOOD, ED ([facility], [facility], [facility] ONLY) - Abnormal; Notable for the following components:   I-stat hCG, quantitative >2,000.0 (*)    All other components within normal limits  LIPASE, BLOOD    EKG None  Radiology No results found.  Procedures Procedures (including critical care time)  Medications Ordered in ED Medications  sodium chloride flush (NS) 0.9 % injection 3 mL (has no administration in time range)     Initial Impression / Assessment and Plan / ED Course  I have reviewed the triage vital signs and the nursing notes.  Pertinent labs & imaging results that were available during my care of the patient were reviewed by me and considered in my medical decision making (see chart for details).  23 year old female here with nausea.  Positive home pregnancy test at home.  Labs today confirm pregnancy, likely early around 3-5 weeks based on last menstrual period.  Labs otherwise reassuring.   BP stable.  Will start prenatals and refer to OB-GYN for ongoing care.  She was given printout of labs today for confirmation of pregnancy for medicaid.  She is eating here and tolerating well, but will give prescription for Diclegis as needed, also discussed using ginger.  She may return to MAU for any new/acute changes.  Final  Clinical Impressions(s) / ED Diagnoses   Final diagnoses:  Less than [redacted] weeks gestation of pregnancy  Nausea    ED Discharge Orders         Ordered    Doxylamine-Pyridoxine 10-10 MG TBEC  Daily at bedtime     06/10/19 0008    Prenatal Vit-Fe Fumarate-FA (PRENATAL COMPLETE) 14-0.4 MG TABS  Daily     06/10/19 0008           Larene Pickett, PA-C 06/10/19 0052    Fatima Blank, MD 06/10/19 2318

## 2019-06-10 NOTE — Discharge Instructions (Signed)
Medications sent to pharmacy, take as directed.  Can use ginger products to help with nausea as well. Follow-up with OB-- call to make appt. Return to the ED for new or worsening symptoms.

## 2019-08-06 NOTE — L&D Delivery Note (Signed)
Operative Delivery Note Patient progressed rapidly from 6 to 10/100/+3 cm.  She then had a prolonged deceleration the 40s with slow return to baseline.  She pushed with two contraction with minimal descent.  Fetal heart tones decreased to the 90s, so she was consented for a vacuum delivery.  The Kiwi vacuum was applied.  She pushed with one contraction and zero pop offs.  At 1:46 AM a viable female was delivered via Vaginal, Spontaneous.  Presentation: vertex; Position: Occiput,, Anterior; Station: +3.  The placenta delivered quickly and without difficulty, but on exam of the vagina, a piece of membrane was noted to be trailing with the cervix.  A uterine sweep was performed and small piece of membrane removed.  An second uterine sweep did not reveal any additional membranes.  2 gm of ancef will be administered due to uterine sweep  Verbal consent: obtained from patient.  Risks and benefits discussed in detail.  Risks include, but are not limited to the risks of anesthesia, bleeding, infection, damage to maternal tissues, fetal cephalhematoma.  There is also the risk of inability to effect vaginal delivery of the head, or shoulder dystocia that cannot be resolved by established maneuvers, leading to the need for emergency cesarean section.  APGAR: 6, 9; weight 3581 g (7 lb 14.3 oz).  Placenta status: Spontaneous, in tact .   Cord:  with the following complications: None.  Cord pH: n/a  Anesthesia:  Epidural Instruments: Kiwi Vacuum Episiotomy: None Lacerations:  Hemostatic sub-clitoral laceration Suture Repair: n/a Est. Blood Loss (mL):  100 mL  Mom to postpartum.  Baby to Couplet care / Skin to Skin.  Lachina Salsberry GEFFEL Maximo Spratling 02/06/2020, 2:19 AM

## 2019-09-09 ENCOUNTER — Ambulatory Visit: Payer: Medicaid Other | Attending: Internal Medicine

## 2019-09-09 DIAGNOSIS — Z20822 Contact with and (suspected) exposure to covid-19: Secondary | ICD-10-CM

## 2019-09-10 LAB — NOVEL CORONAVIRUS, NAA: SARS-CoV-2, NAA: NOT DETECTED

## 2019-09-13 ENCOUNTER — Other Ambulatory Visit: Payer: Self-pay

## 2019-11-27 ENCOUNTER — Other Ambulatory Visit: Payer: Self-pay

## 2019-11-27 ENCOUNTER — Inpatient Hospital Stay (HOSPITAL_COMMUNITY)
Admission: AD | Admit: 2019-11-27 | Discharge: 2019-11-27 | Disposition: A | Discharge status: Home / Self Care | Payer: Medicaid Other | Attending: Obstetrics and Gynecology | Admitting: Obstetrics and Gynecology

## 2019-11-27 ENCOUNTER — Encounter (HOSPITAL_COMMUNITY): Payer: Self-pay | Admitting: Obstetrics and Gynecology

## 2019-11-27 DIAGNOSIS — O99613 Diseases of the digestive system complicating pregnancy, third trimester: Secondary | ICD-10-CM

## 2019-11-27 DIAGNOSIS — Z3A30 30 weeks gestation of pregnancy: Secondary | ICD-10-CM | POA: Insufficient documentation

## 2019-11-27 DIAGNOSIS — O2243 Hemorrhoids in pregnancy, third trimester: Secondary | ICD-10-CM | POA: Diagnosis not present

## 2019-11-27 DIAGNOSIS — K640 First degree hemorrhoids: Secondary | ICD-10-CM

## 2019-11-27 DIAGNOSIS — O26893 Other specified pregnancy related conditions, third trimester: Secondary | ICD-10-CM | POA: Insufficient documentation

## 2019-11-27 DIAGNOSIS — Z3689 Encounter for other specified antenatal screening: Secondary | ICD-10-CM

## 2019-11-27 DIAGNOSIS — Z882 Allergy status to sulfonamides status: Secondary | ICD-10-CM | POA: Diagnosis not present

## 2019-11-27 DIAGNOSIS — K625 Hemorrhage of anus and rectum: Secondary | ICD-10-CM

## 2019-11-27 LAB — URINALYSIS, ROUTINE W REFLEX MICROSCOPIC
Bilirubin Urine: NEGATIVE
Glucose, UA: NEGATIVE mg/dL
Hgb urine dipstick: NEGATIVE
Ketones, ur: NEGATIVE mg/dL
Nitrite: NEGATIVE
Protein, ur: 100 mg/dL — AB
Specific Gravity, Urine: 1.027 (ref 1.005–1.030)
Squamous Epithelial / HPF: >50 — ABNORMAL HIGH (ref 0–5)
pH: 6 (ref 5.0–8.0)

## 2019-11-27 MED ORDER — DIBUCAINE (PERIANAL) 1 % EX OINT: 1.0000 "application " | TOPICAL_OINTMENT | Freq: Two times a day (BID) | CUTANEOUS | 0 refills | Status: DC | PRN

## 2019-11-27 MED ORDER — DOCUSATE SODIUM 100 MG PO CAPS: 100.0000 mg | ORAL_CAPSULE | Freq: Every day | ORAL | 0 refills | Status: DC

## 2019-11-27 NOTE — MAU Provider Note (Signed)
History     CSN: [id number]  Arrival date and time: 11/27/19 1335   First Provider Initiated Contact with Patient 11/27/19 1426      Chief Complaint  Patient presents with  . Rectal Bleeding   23 y.o. G2P1001 @30 .1 wks presenting with rectal bleeding. Noticed the blood this am when trying to have BM. Saw a small clot. Feels certain blood was from rectum. Denies hemorrhoids. Reports recent infrequent BMs d/t taking Fe supplements. Reports good FM. No VB, LOF, or ctx.    OB History    Gravida  2   Para  1   Term  1   Preterm  0   AB  0   Living  1     SAB  0   TAB  0   Ectopic  0   Multiple  0   Live Births  1           Past Medical History:  Diagnosis Date  . Medical history non-contributory     Past Surgical History:  Procedure Laterality Date  . NO PAST SURGERIES      Family History  Problem Relation Age of Onset  . Hypertension Mother   . Diabetes Mother        boderline  . Stroke Maternal Grandmother     Social History   Tobacco Use  . Smoking status: Never Smoker  . Smokeless tobacco: Never Used  Substance Use Topics  . Alcohol use: No  . Drug use: No    Allergies:  Allergies  Allergen Reactions  . Sulfa Antibiotics Palpitations    No medications prior to admission.    Review of Systems  Gastrointestinal: Negative for abdominal pain and rectal pain.  Genitourinary: Negative for vaginal bleeding and vaginal discharge.   Physical Exam   Blood pressure 117/63, pulse (!) 109, temperature 98.3 F (36.8 C), temperature source Oral, resp. rate 18, last menstrual period 04/20/2019, SpO2 99 %, unknown if currently breastfeeding.  Physical Exam  Nursing note and vitals reviewed. Constitutional: She is oriented to person, place, and time. She appears well-developed and well-nourished. No distress.  HENT:  Head: Normocephalic and atraumatic.  Cardiovascular: Normal rate.  Respiratory: Effort normal. No respiratory distress.   Genitourinary: Rectum:     Guaiac result positive (small amt on glove).     External hemorrhoid present.     No internal hemorrhoid.   Musculoskeletal:        General: Normal range of motion.     Cervical back: Normal range of motion.  Neurological: She is alert and oriented to person, place, and time.  Skin: Skin is warm and dry.  Psychiatric: She has a normal mood and affect.  EFM: 150 bpm, mod variability, + accels, no decels Toco: none  Results for orders placed or performed during the hospital encounter of 11/27/19 (from the past 24 hour(s))  Urinalysis, Routine w reflex microscopic     Status: Abnormal   Collection Time: 11/27/19  1:53 PM  Result Value Ref Range   Color, Urine AMBER (A) YELLOW   APPearance CLOUDY (A) CLEAR   Specific Gravity, Urine 1.027 1.005 - 1.030   pH 6.0 5.0 - 8.0   Glucose, UA NEGATIVE NEGATIVE mg/dL   Hgb urine dipstick NEGATIVE NEGATIVE   Bilirubin Urine NEGATIVE NEGATIVE   Ketones, ur NEGATIVE NEGATIVE mg/dL   Protein, ur 11/29/19 (A) NEGATIVE mg/dL   Nitrite NEGATIVE NEGATIVE   Leukocytes,Ua SMALL (A) NEGATIVE   RBC / HPF  0-5 0 - 5 RBC/hpf   WBC, UA 21-50 0 - 5 WBC/hpf   Bacteria, UA MANY (A) NONE SEEN   Squamous Epithelial / LPF >50 (H) 0 - 5   Mucus PRESENT    MAU Course  Procedures  MDM Bleeding likely from hemorrhoids, does not appear to be thrombosed. Discussed comfort measures and recommend increase water and dietary fiber. Stable for discharge home.   Assessment and Plan   1. [redacted] weeks gestation of pregnancy   2. NST (non-stress test) reactive   3. Grade I hemorrhoids   4. Rectal bleeding    Discharge home Follow up at [facility] as scheduled Rx Colace Rx Nupercainal ointment  Allergies as of 11/27/2019      Reactions   Sulfa Antibiotics Palpitations      Medication List    STOP taking these medications   ibuprofen 600 MG tablet Commonly known as: ADVIL   lidocaine 2 % solution Commonly known as: XYLOCAINE    metroNIDAZOLE 500 MG tablet Commonly known as: FLAGYL   naproxen 500 MG tablet Commonly known as: NAPROSYN   oxyCODONE-acetaminophen 5-325 MG tablet Commonly known as: Roxicet     TAKE these medications   dibucaine 1 % Oint Commonly known as: NUPERCAINAL Place 1 application rectally 2 (two) times daily as needed for hemorrhoids.   docusate sodium 100 MG capsule Commonly known as: Colace Take 1 capsule (100 mg total) by mouth daily. What changed: when to take this   Doxylamine-Pyridoxine 10-10 MG Tbec Take 2 tablets by mouth at bedtime.   Prenatal Complete 14-0.4 MG Tabs Take 1 tablet by mouth daily.      Julianne Handler, CNM 11/27/2019, 3:38 PM

## 2019-11-27 NOTE — Discharge Instructions (Signed)

## 2019-11-27 NOTE — MAU Note (Signed)
Pt reports to mau with c/o of bleeding from her rectum. Pt reports she was trying to have a BM 30 min prior and noticed a clot of blood in the toilet.  Pt denies any bleeding from her vagina.  Denies LOF and reports good fetal movement.

## 2019-11-28 LAB — CULTURE, OB URINE

## 2020-01-11 LAB — OB RESULTS CONSOLE GBS: GBS: NEGATIVE

## 2020-02-02 ENCOUNTER — Other Ambulatory Visit: Payer: Self-pay | Admitting: Obstetrics and Gynecology

## 2020-02-05 ENCOUNTER — Inpatient Hospital Stay (EMERGENCY_DEPARTMENT_HOSPITAL)
Admission: AD | Admit: 2020-02-05 | Discharge: 2020-02-05 | Disposition: A | Discharge status: Home / Self Care | Payer: Medicaid Other | Source: Home / Self Care | Attending: Obstetrics | Admitting: Obstetrics

## 2020-02-05 ENCOUNTER — Encounter (HOSPITAL_COMMUNITY): Payer: Self-pay | Admitting: Obstetrics and Gynecology

## 2020-02-05 ENCOUNTER — Encounter (HOSPITAL_COMMUNITY): Payer: Self-pay | Admitting: Obstetrics

## 2020-02-05 ENCOUNTER — Encounter (HOSPITAL_COMMUNITY): Payer: Self-pay | Admitting: Anesthesiology

## 2020-02-05 ENCOUNTER — Other Ambulatory Visit: Payer: Self-pay

## 2020-02-05 ENCOUNTER — Inpatient Hospital Stay (HOSPITAL_COMMUNITY)
Admission: AD | Admit: 2020-02-05 | Discharge: 2020-02-07 | DRG: 807 | Disposition: A | Discharge status: Home / Self Care | Payer: Medicaid Other | Attending: Obstetrics | Admitting: Obstetrics

## 2020-02-05 DIAGNOSIS — Z20822 Contact with and (suspected) exposure to covid-19: Secondary | ICD-10-CM | POA: Diagnosis present

## 2020-02-05 DIAGNOSIS — Z3A40 40 weeks gestation of pregnancy: Secondary | ICD-10-CM

## 2020-02-05 DIAGNOSIS — O471 False labor at or after 37 completed weeks of gestation: Secondary | ICD-10-CM

## 2020-02-05 DIAGNOSIS — O26893 Other specified pregnancy related conditions, third trimester: Secondary | ICD-10-CM | POA: Diagnosis present

## 2020-02-05 DIAGNOSIS — O479 False labor, unspecified: Secondary | ICD-10-CM

## 2020-02-05 DIAGNOSIS — D509 Iron deficiency anemia, unspecified: Secondary | ICD-10-CM | POA: Diagnosis present

## 2020-02-05 DIAGNOSIS — O9902 Anemia complicating childbirth: Principal | ICD-10-CM | POA: Diagnosis present

## 2020-02-05 DIAGNOSIS — Z3689 Encounter for other specified antenatal screening: Secondary | ICD-10-CM

## 2020-02-05 LAB — CBC
HCT: 36.5 % (ref 36.0–46.0)
Hemoglobin: 11.4 g/dL — ABNORMAL LOW (ref 12.0–15.0)
MCH: 25.6 pg — ABNORMAL LOW (ref 26.0–34.0)
MCHC: 31.2 g/dL (ref 30.0–36.0)
MCV: 82 fL (ref 80.0–100.0)
Platelets: 317 10*3/uL (ref 150–400)
RBC: 4.45 MIL/uL (ref 3.87–5.11)
RDW: 22.2 % — ABNORMAL HIGH (ref 11.5–15.5)
WBC: 9 10*3/uL (ref 4.0–10.5)
nRBC: 0 % (ref 0.0–0.2)

## 2020-02-05 LAB — ABO/RH: ABO/RH(D): O POS

## 2020-02-05 LAB — TYPE AND SCREEN
ABO/RH(D): O POS
Antibody Screen: NEGATIVE

## 2020-02-05 LAB — SARS CORONAVIRUS 2 BY RT PCR (HOSPITAL ORDER, PERFORMED IN ~~LOC~~ HOSPITAL LAB): SARS Coronavirus 2: NEGATIVE

## 2020-02-05 MED ORDER — FENTANYL-BUPIVACAINE-NACL 0.5-0.125-0.9 MG/250ML-% EP SOLN
EPIDURAL | Status: AC
  Filled 2020-02-05: qty 250

## 2020-02-05 MED ORDER — OXYTOCIN BOLUS FROM INFUSION
333.0000 mL | Freq: Once | INTRAVENOUS | Status: AC
  Administered 2020-02-06: 333 mL via INTRAVENOUS

## 2020-02-05 MED ORDER — FENTANYL-BUPIVACAINE-NACL 0.5-0.125-0.9 MG/250ML-% EP SOLN: 12.0000 mL/h | EPIDURAL | Status: DC | PRN

## 2020-02-05 MED ORDER — PHENYLEPHRINE 40 MCG/ML (10ML) SYRINGE FOR IV PUSH (FOR BLOOD PRESSURE SUPPORT): 80.0000 ug | PREFILLED_SYRINGE | INTRAVENOUS | Status: DC | PRN

## 2020-02-05 MED ORDER — OXYCODONE-ACETAMINOPHEN 5-325 MG PO TABS: 1.0000 | ORAL_TABLET | ORAL | Status: DC | PRN

## 2020-02-05 MED ORDER — EPHEDRINE 5 MG/ML INJ: 10.0000 mg | INTRAVENOUS | Status: DC | PRN

## 2020-02-05 MED ORDER — TERBUTALINE SULFATE 1 MG/ML IJ SOLN: 0.2500 mg | Freq: Once | INTRAMUSCULAR | Status: DC | PRN

## 2020-02-05 MED ORDER — LACTATED RINGERS IV SOLN: INTRAVENOUS | Status: DC

## 2020-02-05 MED ORDER — LIDOCAINE HCL (PF) 1 % IJ SOLN: 30.0000 mL | INTRAMUSCULAR | Status: DC | PRN

## 2020-02-05 MED ORDER — ONDANSETRON HCL 4 MG/2ML IJ SOLN
4.0000 mg | Freq: Four times a day (QID) | INTRAMUSCULAR | Status: DC | PRN
  Administered 2020-02-06: 4 mg via INTRAVENOUS
  Filled 2020-02-05: qty 2

## 2020-02-05 MED ORDER — LACTATED RINGERS IV SOLN: 500.0000 mL | INTRAVENOUS | Status: DC | PRN

## 2020-02-05 MED ORDER — ACETAMINOPHEN 325 MG PO TABS
650.0000 mg | ORAL_TABLET | ORAL | Status: DC | PRN
  Administered 2020-02-06: 650 mg via ORAL
  Filled 2020-02-05: qty 2

## 2020-02-05 MED ORDER — SOD CITRATE-CITRIC ACID 500-334 MG/5ML PO SOLN: 30.0000 mL | ORAL | Status: DC | PRN

## 2020-02-05 MED ORDER — OXYTOCIN-SODIUM CHLORIDE 30-0.9 UT/500ML-% IV SOLN: 2.5000 [IU]/h | INTRAVENOUS | Status: DC

## 2020-02-05 MED ORDER — LACTATED RINGERS IV SOLN
500.0000 mL | Freq: Once | INTRAVENOUS | Status: AC
  Administered 2020-02-05: 500 mL via INTRAVENOUS

## 2020-02-05 MED ORDER — OXYCODONE-ACETAMINOPHEN 5-325 MG PO TABS: 2.0000 | ORAL_TABLET | ORAL | Status: DC | PRN

## 2020-02-05 MED ORDER — FENTANYL CITRATE (PF) 100 MCG/2ML IJ SOLN: 50.0000 ug | INTRAMUSCULAR | Status: DC | PRN

## 2020-02-05 MED ORDER — DIPHENHYDRAMINE HCL 50 MG/ML IJ SOLN: 12.5000 mg | INTRAMUSCULAR | Status: DC | PRN

## 2020-02-05 MED ORDER — OXYTOCIN-SODIUM CHLORIDE 30-0.9 UT/500ML-% IV SOLN
1.0000 m[IU]/min | INTRAVENOUS | Status: DC
  Administered 2020-02-05: 2 m[IU]/min via INTRAVENOUS
  Filled 2020-02-05: qty 500

## 2020-02-05 NOTE — MAU Note (Signed)
Pt here with reports of contractions and pelvic pressure. Reports contractions are every 5 mins. She denies LOF or vaginal bleeding. Reports she did lose mucous plug. Reports good fetal movement. Cervix was 1cm last week in the office.

## 2020-02-05 NOTE — MAU Note (Signed)
Pt signed paper copy of AVS.  

## 2020-02-05 NOTE — Anesthesia Preprocedure Evaluation (Signed)
Anesthesia Evaluation  Patient identified by MRN, date of birth, ID band Patient awake    Reviewed: Allergy & Precautions, H&P , NPO status , Patient's Chart, lab work & pertinent test results  History of Anesthesia Complications Negative for: history of anesthetic complications  Airway Mallampati: II  TM Distance: >3 FB Neck ROM: full    Dental no notable dental hx.    Pulmonary neg pulmonary ROS,    Pulmonary exam normal        Cardiovascular negative cardio ROS Normal cardiovascular exam Rhythm:regular Rate:Normal     Neuro/Psych negative neurological ROS  negative psych ROS   GI/Hepatic negative GI ROS, Neg liver ROS,   Endo/Other  Morbid obesity  Renal/GU      Musculoskeletal   Abdominal   Peds  Hematology negative hematology ROS (+)   Anesthesia Other Findings   Reproductive/Obstetrics (+) Pregnancy                             Anesthesia Physical Anesthesia Plan  ASA: III  Anesthesia Plan: Epidural   Post-op Pain Management:    Induction:   PONV Risk Score and Plan:   Airway Management Planned:   Additional Equipment:   Intra-op Plan:   Post-operative Plan:   Informed Consent: I have reviewed the patients History and Physical, chart, labs and discussed the procedure including the risks, benefits and alternatives for the proposed anesthesia with the patient or authorized representative who has indicated his/her understanding and acceptance.       Plan Discussed with:   Anesthesia Plan Comments:         Anesthesia Quick Evaluation  

## 2020-02-05 NOTE — Discharge Instructions (Signed)

## 2020-02-05 NOTE — MAU Note (Signed)
Heather Paul is a 24 y.o. at [redacted]w[redacted]d here in MAU reporting:  +contractions Have worsened since being discharged from MAU early this morning.  Denies vaginal bleeding. Reports increase in mucus discharge.  Pain score: 10/10 Vitals:   02/05/20 1646  BP: 135/79  Pulse: 80  Resp: 19  Temp: (!) 97.3 F (36.3 C)  SpO2: 98%     +FM Lab orders placed from triage: mau labor triage order set

## 2020-02-05 NOTE — MAU Note (Signed)
I have communicated with Gerrit Heck, CNM and reviewed vital signs:  Vitals:   02/05/20 0424 02/05/20 0522  BP:  136/82  Pulse: 90 83  Resp:    Temp:      Vaginal exam:  Dilation: 2 Effacement (%): 50 Cervical Position: Posterior Station: -3 Presentation: Vertex Exam by:: Camelia Eng, RN,   Also reviewed contraction pattern and that non-stress test is reactive.  It has been documented that patient is contracting irregularly with minimal cervical change over 1.5 hours not indicating active labor. Patient denies any other complaints.  Based on this report provider has given order for discharge.  A discharge order and diagnosis entered by a provider. Provider reviewed fetal tracing.  Labor discharge instructions reviewed with patient.

## 2020-02-05 NOTE — H&P (Signed)
24 y.o. G2P1001 @ [redacted]w[redacted]d presents with labor.  Otherwise has good fetal movement and no bleeding.  Pregnancy c/b: 1. Increased carrier risk for spinal muscular atrophy on carrier screening.  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 FOB unable to obtain testing 2. Iron deficiency anemia: on PO iron  Past Medical History:  Diagnosis Date  . Medical history non-contributory     Past Surgical History:  Procedure Laterality Date  . NO PAST SURGERIES      OB History  Gravida Para Term Preterm AB Living  2 1 1  0 0 1  SAB TAB Ectopic Multiple Live Births  0 0 0 0 1    # Outcome Date GA Lbr Len/2nd Weight Sex Delivery Anes PTL Lv  2 Current           1 Term 03/17/17 [redacted]w[redacted]d 04:57 / 01:58 3419 g F Vag-Spont EPI  LIV     Birth Comments: None    Social History   Socioeconomic History  . Marital status: Single    Spouse name: Not on file  . Number of children: 0  . Years of education: Not on file  . Highest education level: Not on file  Occupational History  . Occupation: Student    Comment: [employer]  Tobacco Use  . Smoking status: Never Smoker  . Smokeless tobacco: Never Used  Substance and Sexual Activity  . Alcohol use: No  . Drug use: No  . Sexual activity: Yes    Partners: Male    Birth control/protection: None  Other Topics Concern  . Not on file  Social History Narrative  . Not on file   Social Determinants of Health   Financial Resource Strain:   . Difficulty of Paying Living Expenses:   Food Insecurity:   . Worried About [redacted]w[redacted]d in the Last Year:   . Programme researcher, broadcasting/film/video in the Last Year:   Transportation Needs:   . Barista (Medical):   Freight forwarder Lack of Transportation (Non-Medical):   Physical Activity:   . Days of Exercise per Week:   . Minutes of Exercise per Session:   Stress:   . Feeling of Stress :   Social Connections:   . Frequency of Communication with Friends and Family:   . Frequency of  Social Gatherings with Friends and Family:   . Attends Religious Services:   . Active Member of Clubs or Organizations:   . Attends Marland Kitchen Meetings:   Banker Marital Status:   Intimate Partner Violence:   . Fear of Current or Ex-Partner:   . Emotionally Abused:   Marland Kitchen Physically Abused:   . Sexually Abused:    Sulfa antibiotics    Prenatal Transfer Tool  Maternal Diabetes: No Genetic Screening: Normal Maternal Ultrasounds/Referrals: Normal Fetal Ultrasounds or other Referrals:  None Maternal Substance Abuse:  No Significant Maternal Medications:  None Significant Maternal Lab Results: Group B Strep negative  ABO, Rh: --/--/PENDING (07/03 1805) Antibody: PENDING (07/03 1805) Rubella:  Immune RPR:   NR HBsAg:   Neg HIV:   Neg GBS:   Neg     Vitals:   02/05/20 1825 02/05/20 1840  BP: 129/83 130/70  Pulse: 85 87  Resp: 18   Temp: 98.4 F (36.9 C)   SpO2:  100%     General:  NAD Abdomen:  soft, gravid, EFW 7.5# Ex:  1+ edema SVE:  5/70/-3 per RN FHTs:  140s, moderate variability, + accels, category 1 Toco:  q2-4 minutes   A/P   24 y.o. G2P1001 [redacted]w[redacted]d presents with labor Admit to L&D Epidural upon request FSR/ vtx/ GBS negative  Heather Paul

## 2020-02-05 NOTE — MAU Provider Note (Signed)
S: Heather Paul is a 24 y.o. G2P1001 at [redacted]w[redacted]d  who presents to MAU today for labor evaluation.   After nurse recheck cervix with minimal change.  Patient requests discharge.   Cervical exam by RN:  Dilation: 1.5 Effacement (%): 50 Cervical Position: Posterior Station: -3 Presentation: Vertex Exam by:: Camelia Eng, RN  Fetal Monitoring: Baseline: 150 Variability: Moderate Accelerations: Present Decelerations: Present Contractions: Occ with irritability  MDM Discussed patient with RN. NST reviewed.   A: SIUP at [redacted]w[redacted]d  False labor NST Reactive  P: Discharge home Labor precautions and kick counts included in AVS Patient to follow-up with primary ob as scheduled  Patient may return to MAU as needed or when in labor   Gerrit Heck, [place] 02/05/2020 5:20 AM

## 2020-02-06 ENCOUNTER — Encounter (HOSPITAL_COMMUNITY): Payer: Self-pay | Admitting: Obstetrics

## 2020-02-06 LAB — CBC
HCT: 34.5 % — ABNORMAL LOW (ref 36.0–46.0)
Hemoglobin: 10.9 g/dL — ABNORMAL LOW (ref 12.0–15.0)
MCH: 26.2 pg (ref 26.0–34.0)
MCHC: 31.6 g/dL (ref 30.0–36.0)
MCV: 82.9 fL (ref 80.0–100.0)
Platelets: 312 10*3/uL (ref 150–400)
RBC: 4.16 MIL/uL (ref 3.87–5.11)
RDW: 22.3 % — ABNORMAL HIGH (ref 11.5–15.5)
WBC: 13.4 10*3/uL — ABNORMAL HIGH (ref 4.0–10.5)
nRBC: 0 % (ref 0.0–0.2)

## 2020-02-06 LAB — RPR: RPR Ser Ql: NONREACTIVE

## 2020-02-06 MED ORDER — CEFAZOLIN SODIUM-DEXTROSE 2-4 GM/100ML-% IV SOLN
2.0000 g | Freq: Once | INTRAVENOUS | Status: AC
  Administered 2020-02-06: 2 g via INTRAVENOUS
  Filled 2020-02-06: qty 100

## 2020-02-06 MED ORDER — OXYCODONE HCL 5 MG PO TABS: 10.0000 mg | ORAL_TABLET | ORAL | Status: DC | PRN

## 2020-02-06 MED ORDER — TETANUS-DIPHTH-ACELL PERTUSSIS 5-2.5-18.5 LF-MCG/0.5 IM SUSP: 0.5000 mL | Freq: Once | INTRAMUSCULAR | Status: DC

## 2020-02-06 MED ORDER — COCONUT OIL OIL: 1.0000 "application " | TOPICAL_OIL | Status: DC | PRN

## 2020-02-06 MED ORDER — WITCH HAZEL-GLYCERIN EX PADS: 1.0000 "application " | MEDICATED_PAD | CUTANEOUS | Status: DC | PRN

## 2020-02-06 MED ORDER — SENNOSIDES-DOCUSATE SODIUM 8.6-50 MG PO TABS
2.0000 | ORAL_TABLET | ORAL | Status: DC
  Administered 2020-02-06: 2 via ORAL
  Filled 2020-02-06: qty 2

## 2020-02-06 MED ORDER — IBUPROFEN 600 MG PO TABS
600.0000 mg | ORAL_TABLET | Freq: Four times a day (QID) | ORAL | Status: DC
  Administered 2020-02-06 – 2020-02-07 (×6): 600 mg via ORAL
  Filled 2020-02-06 (×6): qty 1

## 2020-02-06 MED ORDER — PRENATAL MULTIVITAMIN CH
1.0000 | ORAL_TABLET | Freq: Every day | ORAL | Status: DC
  Administered 2020-02-06 – 2020-02-07 (×2): 1 via ORAL
  Filled 2020-02-06 (×2): qty 1

## 2020-02-06 MED ORDER — ONDANSETRON HCL 4 MG PO TABS: 4.0000 mg | ORAL_TABLET | ORAL | Status: DC | PRN

## 2020-02-06 MED ORDER — DIPHENHYDRAMINE HCL 25 MG PO CAPS: 25.0000 mg | ORAL_CAPSULE | Freq: Four times a day (QID) | ORAL | Status: DC | PRN

## 2020-02-06 MED ORDER — ONDANSETRON HCL 4 MG/2ML IJ SOLN: 4.0000 mg | INTRAMUSCULAR | Status: DC | PRN

## 2020-02-06 MED ORDER — DIBUCAINE (PERIANAL) 1 % EX OINT: 1.0000 "application " | TOPICAL_OINTMENT | CUTANEOUS | Status: DC | PRN

## 2020-02-06 MED ORDER — ACETAMINOPHEN 325 MG PO TABS
650.0000 mg | ORAL_TABLET | ORAL | Status: DC | PRN
  Administered 2020-02-06 – 2020-02-07 (×2): 650 mg via ORAL
  Filled 2020-02-06 (×2): qty 2

## 2020-02-06 MED ORDER — BENZOCAINE-MENTHOL 20-0.5 % EX AERO
1.0000 "application " | INHALATION_SPRAY | CUTANEOUS | Status: DC | PRN
  Filled 2020-02-06: qty 56

## 2020-02-06 MED ORDER — OXYCODONE HCL 5 MG PO TABS: 5.0000 mg | ORAL_TABLET | ORAL | Status: DC | PRN

## 2020-02-06 MED ORDER — SIMETHICONE 80 MG PO CHEW: 80.0000 mg | CHEWABLE_TABLET | ORAL | Status: DC | PRN

## 2020-02-06 NOTE — Consult Note (Signed)
Neonatology Note:  Attendance at Code Apgar:   Our team responded to a Code Apgar call to room # 213 following NSVD, due to infant with apnea. The requesting physician was Dr. Chestine Spore. The mother is a G2P1001 , GBS neg with good PNC c/b increased carrier risk for spinal muscular atrophy on carrier screening.  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 FOB unable to obtain testing. ROM occurred 5h 25m prior to delivery and the fluid was thin meconium .  At delivery, the baby was hypotonic and apneic. The OB nursing staff in attendance gave vigorous stimulation and a Code Apgar was called. Our team arrived at ~1-2 minutes of life, at which time the baby was on RA crying and with good tone.  Sao2 placed and in 60s without improvement.  Gave BBO2 with immediate increase to appropriate level. When removed, vitals remained stable and appropriate, with good tone and perfusion, clearing lungs. Ap 6/9.  I spoke with the parents in the DR, then transferred the baby to the Pediatrician's care.   Please do not hesitate in contacting us if further concerns.   Dineen Kid Leary Roca, MD

## 2020-02-06 NOTE — Anesthesia Postprocedure Evaluation (Signed)
Anesthesia Post Note  Patient: Heather Paul  Procedure(s) Performed: AN AD HOC LABOR EPIDURAL     Patient location during evaluation: Mother Baby Anesthesia Type: Epidural Level of consciousness: awake and alert and oriented Pain management: satisfactory to patient Vital Signs Assessment: post-procedure vital signs reviewed and stable Respiratory status: spontaneous breathing and nonlabored ventilation Cardiovascular status: stable Postop Assessment: no headache, no backache, no signs of nausea or vomiting, adequate PO intake, patient able to bend at knees and able to ambulate (patient up walking) Anesthetic complications: no   No complications documented.  Last Vitals:  Vitals:   02/06/20 0413 02/06/20 0515  BP: 118/62 124/87  Pulse: 62 61  Resp: 20 18  Temp: 36.9 C 36.6 C  SpO2: 99% 100%    Last Pain:  Vitals:   02/06/20 0515  TempSrc: Oral  PainSc: 0-No pain   Pain Goal: Patients Stated Pain Goal: 0 (02/05/20 1648)                 Madison Hickman

## 2020-02-06 NOTE — Progress Notes (Signed)
Epidural capped at 0200 at 02/06/2020. Epidural removed at 0315.

## 2020-02-06 NOTE — Progress Notes (Signed)
Patient is doing well.  She is ambulating, voiding, tolerating PO.  Pain control is good.  Lochia is appropriate  Vitals:   02/06/20 0316 02/06/20 0331 02/06/20 0413 02/06/20 0515  BP: (!) 103/44 110/61 118/62 124/87  Pulse: 75 85 62 61  Resp: 16 16 20 18   Temp:   98.4 F (36.9 C) 97.8 F (36.6 C)  TempSrc:   Oral Oral  SpO2:   99% 100%    NAD Fundus firm Ext: trace edema b/l  Lab Results  Component Value Date   WBC 13.4 (H) 02/06/2020   HGB 10.9 (L) 02/06/2020   HCT 34.5 (L) 02/06/2020   MCV 82.9 02/06/2020   PLT 312 02/06/2020    --/--/O POS, O POS Performed at [facility], [address], [place], [place]  (07/03 1805)/RImmune  A/P 24 y.o. G2P2002 PPD#0. Routine care.   Expect d/c tomorrow.    [facility]FFEL [facility]

## 2020-02-07 MED ORDER — IBUPROFEN 600 MG PO TABS: 600.0000 mg | ORAL_TABLET | Freq: Four times a day (QID) | ORAL | 0 refills | Status: DC

## 2020-02-07 NOTE — Progress Notes (Signed)
PPD Pt without complaints. Would like to go home. Baby doing well Lochia mild VSSAF IMP/Stable Plan/ Will discharge

## 2020-02-07 NOTE — Discharge Summary (Signed)
Postpartum Discharge Summary      Patient Name: Heather Paul DOB: 1996/02/15 MRN: [id number]  Date of admission: 02/05/2020 Delivery date:02/06/2020  Delivering provider: Marlow Baars  Date of discharge: 02/07/2020  Admitting diagnosis: Normal labor [O80, Z37.9] Intrauterine pregnancy: [redacted]w[redacted]d        Discharge diagnosis: Term Pregnancy Delivered                                              Hospital course: Onset of Labor With Vaginal Delivery      24 y.o. yo [id number] at [redacted]w[redacted]d was admitted in Active Labor on 02/05/2020. Patient had an uncomplicated labor course as follows:  Membrane Rupture Time/Date: 8:09 PM ,02/05/2020   Delivery Method:Vaginal, Vacuum (Extractor)  Episiotomy: None  Lacerations:    Patient had an uncomplicated postpartum course.  She is ambulating, tolerating a regular diet, passing flatus, and urinating well. Patient is discharged home in stable condition on 02/07/20.  Newborn Data: Birth date:02/06/2020  Birth time:1:46 AM  Gender:Female  Living status:Living  Apgars:6 ,9  Weight:3581 g   Physical exam  Vitals:   02/06/20 1250 02/06/20 1740 02/06/20 2011 02/07/20 0521  BP: 107/60 107/67 (!) 109/59 (!) 110/58  Pulse: 64 74 79 73  Resp: 18 16 20 16   Temp: 98.2 F (36.8 C) 97.9 F (36.6 C) 97.7 F (36.5 C) 97.6 F (36.4 C)  TempSrc: Oral Oral Oral Oral  SpO2: 100% 97% 100% 100%   General: alert Lochia: appropriate Labs: Lab Results  Component Value Date   WBC 13.4 (H) 02/06/2020   HGB 10.9 (L) 02/06/2020   HCT 34.5 (L) 02/06/2020   MCV 82.9 02/06/2020   PLT 312 02/06/2020   CMP Latest Ref Rng & Units 06/09/2019  Glucose 70 - 99 mg/dL 89  BUN 6 - 20 mg/dL 7  Creatinine 13/11/2018 - 7.34 mg/dL 1.93  Sodium 7.90 - 240 mmol/L 136  Potassium 3.5 - 5.1 mmol/L 3.3(L)  Chloride 98 - 111 mmol/L 104  CO2 22 - 32 mmol/L 22  Calcium 8.9 - 10.3 mg/dL 973)  Total Protein 6.5 - 8.1 g/dL 6.8  Total Bilirubin 0.3 - 1.2 mg/dL 0.5  Alkaline Phos 38 - 126 U/L 51   AST 15 - 41 U/L 14(L)  ALT 0 - 44 U/L 12   Edinburgh Score: Edinburgh Postnatal Depression Scale Screening Tool 02/06/2020  I have been able to laugh and see the funny side of things. 0  I have looked forward with enjoyment to things. 0  I have blamed myself unnecessarily when things went wrong. 1  I have been anxious or worried for no good reason. 2  I have felt scared or panicky for no good reason. 2  Things have been getting on top of me. 0  I have been so unhappy that I have had difficulty sleeping. 0  I have felt sad or miserable. 0  I have been so unhappy that I have been crying. 0  The thought of harming myself has occurred to me. 0  Edinburgh Postnatal Depression Scale Total 5      After visit meds:  Allergies as of 02/07/2020      Reactions   Sulfa Antibiotics Palpitations      Medication List    STOP taking these medications   dibucaine 1 % Oint Commonly known as: NUPERCAINAL   docusate  sodium 100 MG capsule Commonly known as: Colace   Doxylamine-Pyridoxine 10-10 MG Tbec     TAKE these medications   FeroSul 325 (65 FE) MG tablet Generic drug: ferrous sulfate Take 325 mg by mouth daily.   ibuprofen 600 MG tablet Commonly known as: ADVIL Take 1 tablet (600 mg total) by mouth every 6 (six) hours.   Prenatal Complete 14-0.4 MG Tabs Take 1 tablet by mouth daily.        Discharge home in stable condition Discharge instruction: per After Visit Summary and Postpartum booklet. Activity: Advance as tolerated. Pelvic rest for 6 weeks.  Diet: routine diet Follow up Visit:      02/07/2020 Levi Aland, MD

## 2020-02-11 ENCOUNTER — Inpatient Hospital Stay (HOSPITAL_COMMUNITY)
Admission: AD | Admit: 2020-02-11 | Payer: Medicaid Other | Source: Home / Self Care | Admitting: Obstetrics and Gynecology

## 2020-02-11 ENCOUNTER — Inpatient Hospital Stay (HOSPITAL_COMMUNITY): Payer: Medicaid Other

## 2020-02-22 MED ORDER — SODIUM CHLORIDE (PF) 0.9 % IJ SOLN
INTRAMUSCULAR | Status: DC | PRN
  Administered 2020-02-05: 12 mL/h via EPIDURAL

## 2020-02-22 MED ORDER — LIDOCAINE HCL (PF) 1 % IJ SOLN
INTRAMUSCULAR | Status: DC | PRN
  Administered 2020-02-05: 10 mL via EPIDURAL

## 2020-02-22 NOTE — Anesthesia Procedure Notes (Addendum)
Epidural Patient location during procedure: OB Start time: 02/05/2020 6:34 PM End time: 02/05/2020 6:43 PM  Staffing Anesthesiologist: Lucretia Kern, MD Performed: anesthesiologist   Preanesthetic Checklist Completed: patient identified, IV checked, risks and benefits discussed, monitors and equipment checked, pre-op evaluation and timeout performed  Epidural Patient position: sitting Prep: DuraPrep Patient monitoring: heart rate, continuous pulse ox and blood pressure Approach: midline Location: L3-L4 Injection technique: LOR air  Needle:  Needle type: Tuohy  Needle gauge: 17 G Needle length: 9 cm Catheter type: closed end flexible Catheter size: 19 Gauge Test dose: negative  Assessment Events: blood not aspirated, injection not painful, no injection resistance, no paresthesia and negative IV test  Additional Notes Reason for block:procedure for pain

## 2020-06-05 ENCOUNTER — Other Ambulatory Visit: Payer: Self-pay

## 2020-06-05 ENCOUNTER — Ambulatory Visit (HOSPITAL_COMMUNITY)
Admission: EM | Admit: 2020-06-05 | Discharge: 2020-06-05 | Disposition: A | Discharge status: Home / Self Care | Payer: Medicaid Other | Attending: Family Medicine | Admitting: Family Medicine

## 2020-06-05 ENCOUNTER — Encounter (HOSPITAL_COMMUNITY): Payer: Self-pay

## 2020-06-05 DIAGNOSIS — K529 Noninfective gastroenteritis and colitis, unspecified: Secondary | ICD-10-CM

## 2020-06-05 DIAGNOSIS — R197 Diarrhea, unspecified: Secondary | ICD-10-CM

## 2020-06-05 DIAGNOSIS — R1013 Epigastric pain: Secondary | ICD-10-CM | POA: Diagnosis not present

## 2020-06-05 DIAGNOSIS — R1011 Right upper quadrant pain: Secondary | ICD-10-CM

## 2020-06-05 DIAGNOSIS — Z3202 Encounter for pregnancy test, result negative: Secondary | ICD-10-CM | POA: Diagnosis not present

## 2020-06-05 DIAGNOSIS — Z20822 Contact with and (suspected) exposure to covid-19: Secondary | ICD-10-CM | POA: Diagnosis not present

## 2020-06-05 DIAGNOSIS — Z882 Allergy status to sulfonamides status: Secondary | ICD-10-CM | POA: Insufficient documentation

## 2020-06-05 LAB — POCT URINALYSIS DIPSTICK, ED / UC
Bilirubin Urine: NEGATIVE
Glucose, UA: NEGATIVE mg/dL
Hgb urine dipstick: NEGATIVE
Leukocytes,Ua: NEGATIVE
Nitrite: NEGATIVE
Protein, ur: NEGATIVE mg/dL
Specific Gravity, Urine: 1.025 (ref 1.005–1.030)
Urobilinogen, UA: 0.2 mg/dL (ref 0.0–1.0)
pH: 6.5 (ref 5.0–8.0)

## 2020-06-05 LAB — POC URINE PREG, ED: Preg Test, Ur: NEGATIVE

## 2020-06-05 MED ORDER — ONDANSETRON 8 MG PO TBDP: 8.0000 mg | ORAL_TABLET | Freq: Three times a day (TID) | ORAL | 0 refills | Status: DC | PRN

## 2020-06-05 MED ORDER — LOPERAMIDE HCL 2 MG PO CAPS
2.0000 mg | ORAL_CAPSULE | Freq: Two times a day (BID) | ORAL | 0 refills | Status: DC | PRN
Start: 2020-06-05 — End: 2022-12-01

## 2020-06-05 NOTE — ED Triage Notes (Signed)
Pt in with c/o diarrhea, and nausea that started 2 days ago.   Pt has not had medication for sxs  Denies fever, vomiting, runny nose, cough, sob

## 2020-06-05 NOTE — Discharge Instructions (Signed)

## 2020-06-05 NOTE — ED Provider Notes (Signed)
[facility]   MRN: [id number] DOB: 1996/01/13  Subjective:   Heather Paul is a 24 y.o. female presenting for 2 day hx of acute onset nausea, diarrhea. Patient denies any chance of pregnancy.  Denies any recent antibiotic use, recent travel.  States that she does eat pork and some heavy meals.  Tries to avoid fried foods and restaurant food.  Denies any recent raw or uncooked foods.  Denies drinking any unfiltered water recently.  Denies fever, chest pain, cough, loss of sense of taste and smell, vomiting, bloody stools.  Has had about 2 bouts of diarrhea per day.  Has mostly been watery.  No current facility-administered medications for this encounter.  Current Outpatient Medications:  .  FEROSUL 325 (65 Fe) MG tablet, Take 325 mg by mouth daily., Disp: , Rfl:  .  ibuprofen (ADVIL) 600 MG tablet, Take 1 tablet (600 mg total) by mouth every 6 (six) hours., Disp: 30 tablet, Rfl: 0 .  Prenatal Vit-Fe Fumarate-FA (PRENATAL COMPLETE) 14-0.4 MG TABS, Take 1 tablet by mouth daily. (Patient not taking: Reported on 02/07/2020), Disp: 60 tablet, Rfl: 0   Allergies  Allergen Reactions  . Sulfa Antibiotics Palpitations    Past Medical History:  Diagnosis Date  . Medical history non-contributory      Past Surgical History:  Procedure Laterality Date  . NO PAST SURGERIES      Family History  Problem Relation Age of Onset  . Hypertension Mother   . Diabetes Mother        boderline  . Stroke Maternal Grandmother     Social History   Tobacco Use  . Smoking status: Never Smoker  . Smokeless tobacco: Never Used  Substance Use Topics  . Alcohol use: No  . Drug use: No    ROS   Objective:   Vitals: BP (!) 115/59 (BP Location: Right Arm)   Pulse 60   Temp 98 F (36.7 C) (Oral)   Resp 18   LMP 05/26/2020 (Approximate)   SpO2 96%   Breastfeeding No   Physical Exam Constitutional:      General: She is not in acute distress.    Appearance: Normal  appearance. She is well-developed. She is obese. She is not ill-appearing, toxic-appearing or diaphoretic.  HENT:     Head: Normocephalic and atraumatic.     Right Ear: External ear normal.     Left Ear: External ear normal.     Nose: Nose normal.     Mouth/Throat:     Mouth: Mucous membranes are moist.     Pharynx: Oropharynx is clear.  Eyes:     General: No scleral icterus.    Extraocular Movements: Extraocular movements intact.     Pupils: Pupils are equal, round, and reactive to light.  Cardiovascular:     Rate and Rhythm: Normal rate and regular rhythm.     Heart sounds: Normal heart sounds. No murmur heard.  No friction rub. No gallop.   Pulmonary:     Effort: Pulmonary effort is normal. No respiratory distress.     Breath sounds: Normal breath sounds. No stridor. No wheezing, rhonchi or rales.  Abdominal:     General: Bowel sounds are normal. There is no distension.     Palpations: Abdomen is soft. There is no mass.     Tenderness: There is generalized abdominal tenderness and tenderness in the right upper quadrant and epigastric area. There is no right CVA tenderness, left CVA tenderness, guarding or  rebound.  Skin:    General: Skin is warm and dry.     Coloration: Skin is not pale.     Findings: No rash.  Neurological:     General: No focal deficit present.     Mental Status: She is alert and oriented to person, place, and time.  Psychiatric:        Mood and Affect: Mood normal.        Behavior: Behavior normal.        Thought Content: Thought content normal.        Judgment: Judgment normal.     Results for orders placed or performed during the hospital encounter of 06/05/20 (from the past 24 hour(s))  POCT Urinalysis Dipstick (ED/UC)     Status: Abnormal   Collection Time: 06/05/20  5:27 PM  Result Value Ref Range   Glucose, UA NEGATIVE NEGATIVE mg/dL   Bilirubin Urine NEGATIVE NEGATIVE   Ketones, ur TRACE (A) NEGATIVE mg/dL   Specific Gravity, Urine 1.025  1.005 - 1.030   Hgb urine dipstick NEGATIVE NEGATIVE   pH 6.5 5.0 - 8.0   Protein, ur NEGATIVE NEGATIVE mg/dL   Urobilinogen, UA 0.2 0.0 - 1.0 mg/dL   Nitrite NEGATIVE NEGATIVE   Leukocytes,Ua NEGATIVE NEGATIVE  POC urine preg, ED (not at [facility])     Status: None   Collection Time: 06/05/20  5:35 PM  Result Value Ref Range   Preg Test, Ur NEGATIVE NEGATIVE     Assessment and Plan :   PDMP not reviewed this encounter.  1. Gastroenteritis   2. Right upper quadrant abdominal pain   3. Epigastric pain     Previous labs do not support biliary colic. Will manage for suspected viral gastroenteritis with supportive care. Recommended patient hydrate well, eat light meals and maintain electrolytes. Will use Zofran and Imodium for nausea, vomiting and diarrhea. Counseled patient on potential for adverse effects with medications prescribed/recommended today, ER and return-to-clinic precautions discussed, patient verbalized understanding.    Wallis Bamberg, PA-C 06/05/20 1810

## 2020-06-06 LAB — SARS CORONAVIRUS 2 (TAT 6-24 HRS): SARS Coronavirus 2: NEGATIVE

## 2020-09-17 ENCOUNTER — Other Ambulatory Visit: Payer: Self-pay

## 2020-09-17 ENCOUNTER — Emergency Department (HOSPITAL_COMMUNITY)
Admission: EM | Admit: 2020-09-17 | Discharge: 2020-09-17 | Disposition: A | Discharge status: Home / Self Care | Payer: Medicaid Other | Attending: Emergency Medicine | Admitting: Emergency Medicine

## 2020-09-17 ENCOUNTER — Encounter (HOSPITAL_COMMUNITY): Payer: Self-pay

## 2020-09-17 DIAGNOSIS — R059 Cough, unspecified: Secondary | ICD-10-CM | POA: Diagnosis present

## 2020-09-17 DIAGNOSIS — U071 COVID-19: Secondary | ICD-10-CM | POA: Diagnosis not present

## 2020-09-17 LAB — SARS CORONAVIRUS 2 (TAT 6-24 HRS): SARS Coronavirus 2: POSITIVE — AB

## 2020-09-17 NOTE — ED Triage Notes (Signed)
Patient complains of body aches and chills and had positive covid test at home on Friday. Patient alert and in no distress, no fever on arrival

## 2020-09-17 NOTE — ED Provider Notes (Signed)
[facility] EMERGENCY DEPARTMENT Provider Note   CSN: [id number] Arrival date & time: 09/17/20  1305     History COVID symptoms   Heather Paul is a 25 y.o. female with no signifcant past medical history who presents for evaluation of Covid-like symptoms.  Patient myalgias, chills, cough, congestion and scratchy throat.  She is tolerating p.o. intake without difficulty.  Has not taken anything for symptoms.  Took it at home Covid test which was positive.  States she needs a confirmatory test.  Denies additional aggravating or alleviating factors.  No fever, headache, lightheadedness, dizziness, chest pain, shortness of breath, abdominal pain, diarrhea or dysuria.  History obtained from patient and past medical records.  No interpreter is used  HPI     Past Medical History:  Diagnosis Date  . Medical history non-contributory     Patient Active Problem List   Diagnosis Date Noted  . Normal labor 02/05/2020  . Full-term premature rupture of membranes with onset of labor within 24 hours of rupture 03/15/2017    Past Surgical History:  Procedure Laterality Date  . NO PAST SURGERIES       OB History    Gravida  2   Para  2   Term  2   Preterm  0   AB  0   Living  2     SAB  0   IAB  0   Ectopic  0   Multiple  0   Live Births  2           Family History  Problem Relation Age of Onset  . Hypertension Mother   . Diabetes Mother        boderline  . Stroke Maternal Grandmother     Social History   Tobacco Use  . Smoking status: Never Smoker  . Smokeless tobacco: Never Used  Substance Use Topics  . Alcohol use: No  . Drug use: No    Home Medications Prior to Admission medications   Medication Sig Start Date End Date Taking? Authorizing Provider  FEROSUL 325 (65 Fe) MG tablet Take 325 mg by mouth daily. 11/09/19   [provider]  ibuprofen (ADVIL) 600 MG tablet Take 1 tablet (600 mg total) by mouth every 6 (six)  hours. 02/07/20   Levi Aland, MD  loperamide (IMODIUM) 2 MG capsule Take 1 capsule (2 mg total) by mouth 2 (two) times daily as needed for diarrhea or loose stools. 06/05/20   Wallis Bamberg, PA-C  ondansetron (ZOFRAN-ODT) 8 MG disintegrating tablet Take 1 tablet (8 mg total) by mouth every 8 (eight) hours as needed for nausea or vomiting. 06/05/20   Wallis Bamberg, PA-C  Prenatal Vit-Fe Fumarate-FA (PRENATAL COMPLETE) 14-0.4 MG TABS Take 1 tablet by mouth daily. Patient not taking: Reported on 02/07/2020 06/10/19   Garlon Hatchet, PA-C    Allergies    Sulfa antibiotics  Review of Systems   Review of Systems  Constitutional: Positive for chills.  HENT: Positive for congestion and rhinorrhea.   Respiratory: Positive for cough.   Cardiovascular: Negative.   Gastrointestinal: Negative.   Genitourinary: Negative.   Musculoskeletal: Positive for myalgias.  Skin: Negative.   Neurological: Negative.     Physical Exam Updated Vital Signs BP 120/74 (BP Location: Right Arm)   Pulse 84   Temp 98.7 F (37.1 C)   Resp 16   SpO2 100%   Physical Exam Vitals and nursing note reviewed.  Constitutional:  General: She is not in acute distress.    Appearance: She is not ill-appearing, toxic-appearing or diaphoretic.  HENT:     Head: Normocephalic and atraumatic.     Jaw: There is normal jaw occlusion.     Mouth/Throat:     Comments:  Phonation normal. Neck:     Comments: No Neck stiffness or neck rigidity.  No meningismus.  No cervical lymphadenopathy. Cardiovascular:     Rate and Rhythm: Normal rate.     Pulses: Normal pulses.          Radial pulses are 2+ on the right side and 2+ on the left side.     Heart sounds: Normal heart sounds.     Comments: No murmurs rubs or gallops. Pulmonary:     Effort: Pulmonary effort is normal.     Breath sounds: Normal breath sounds and air entry.     Comments: Clear to auscultation bilaterally without wheeze, rhonchi or rales.  No accessory muscle  usage.  Able speak in full sentences. Abdominal:     General: Bowel sounds are normal.     Palpations: Abdomen is soft.     Tenderness: There is no abdominal tenderness.     Comments: Soft, nontender without rebound or guarding.    Musculoskeletal:     Comments: Moves all 4 extremities without difficulty.  Lower extremities without edema, erythema or warmth.  Skin:    Comments: Brisk capillary refill.  No rashes or lesions.  Neurological:     Mental Status: She is alert.     Comments: Ambulatory in department without difficulty.  Cranial nerves II through XII grossly intact.  No facial droop.  No aphasia.     ED Results / Procedures / Treatments   Labs (all labs ordered are listed, but only abnormal results are displayed) Labs Reviewed  SARS CORONAVIRUS 2 (TAT 6-24 HRS)    EKG None  Radiology No results found.  Procedures Procedures   Medications Ordered in ED Medications - No data to display  ED Course  I have reviewed the triage vital signs and the nursing notes.  Pertinent labs & imaging results that were available during my care of the patient were reviewed by me and considered in my medical decision making (see chart for details).  Patient here for requesting confirmatory positive Covid test.  Had positive antigen at home test.  She is with myalgias, cough, chills.  Is otherwise well.  Stable vital signs.  Covid test done.  She will follow-up outpatient.  The patient has been appropriately medically screened and/or stabilized in the ED. I have low suspicion for any other emergent medical condition which would require further screening, evaluation or treatment in the ED or require inpatient management.  Patient is hemodynamically stable and in no acute distress.  Patient able to ambulate in department prior to ED.  Evaluation does not show acute pathology that would require ongoing or additional emergent interventions while in the emergency department or further  inpatient treatment.  I have discussed the diagnosis with the patient and answered all questions.  Pain is been managed while in the emergency department and patient has no further complaints prior to discharge.  Patient is comfortable with plan discussed in room and is stable for discharge at this time.  I have discussed strict return precautions for returning to the emergency department.  Patient was encouraged to follow-up with PCP/specialist refer to at discharge.    MDM Rules/Calculators/A&P  Coretta Leisey was evaluated in Emergency Department on 09/17/2020 for the symptoms described in the history of present illness. She was evaluated in the context of the global COVID-19 pandemic, which necessitated consideration that the patient might be at risk for infection with the SARS-CoV-2 virus that causes COVID-19. Institutional protocols and algorithms that pertain to the evaluation of patients at risk for COVID-19 are in a state of rapid change based on information released by regulatory bodies including the CDC and federal and state organizations. These policies and algorithms were followed during the patient's care in the ED. Final Clinical Impression(s) / ED Diagnoses Final diagnoses:  COVID    Rx / DC Orders ED Discharge Orders    None       Havannah Streat A, PA-C 09/17/20 1430    Jacalyn Lefevre, MD 09/17/20 1458

## 2020-09-18 ENCOUNTER — Telehealth: Payer: Self-pay

## 2020-09-18 NOTE — Telephone Encounter (Signed)
Called to discuss with patient about COVID-19 symptoms and the use of one of the available treatments for those with mild to moderate Covid symptoms and at a high risk of hospitalization.  Pt appears to qualify for outpatient treatment due to co-morbid conditions and/or a member of an at-risk group in accordance with the FDA Emergency Use Authorization.    Symptom onset: Unknown Vaccinated: Unknown Booster? Unknown Immunocompromised? No Qualifiers: Obesity  Unable to reach pt - Phone recording states pt. Cannot receive calls at this time.  Esther Hardy

## 2020-12-10 ENCOUNTER — Other Ambulatory Visit: Payer: Self-pay

## 2020-12-10 ENCOUNTER — Encounter (HOSPITAL_COMMUNITY): Payer: Self-pay

## 2020-12-10 ENCOUNTER — Emergency Department (HOSPITAL_COMMUNITY)
Admission: EM | Admit: 2020-12-10 | Discharge: 2020-12-10 | Disposition: A | Discharge status: Home / Self Care | Payer: No Typology Code available for payment source | Attending: Emergency Medicine | Admitting: Emergency Medicine

## 2020-12-10 ENCOUNTER — Emergency Department (HOSPITAL_COMMUNITY): Payer: No Typology Code available for payment source

## 2020-12-10 DIAGNOSIS — Y92410 Unspecified street and highway as the place of occurrence of the external cause: Secondary | ICD-10-CM | POA: Diagnosis not present

## 2020-12-10 DIAGNOSIS — R079 Chest pain, unspecified: Secondary | ICD-10-CM | POA: Diagnosis not present

## 2020-12-10 DIAGNOSIS — R1030 Lower abdominal pain, unspecified: Secondary | ICD-10-CM | POA: Insufficient documentation

## 2020-12-10 LAB — COMPREHENSIVE METABOLIC PANEL
ALT: 18 U/L (ref 0–44)
AST: 17 U/L (ref 15–41)
Albumin: 3.6 g/dL (ref 3.5–5.0)
Alkaline Phosphatase: 51 U/L (ref 38–126)
Anion gap: 7 (ref 5–15)
BUN: 7 mg/dL (ref 6–20)
CO2: 24 mmol/L (ref 22–32)
Calcium: 8.8 mg/dL — ABNORMAL LOW (ref 8.9–10.3)
Chloride: 106 mmol/L (ref 98–111)
Creatinine, Ser: 0.67 mg/dL (ref 0.44–1.00)
GFR, Estimated: >60 mL/min (ref >60)
Glucose, Bld: 94 mg/dL (ref 70–99)
Potassium: 3.8 mmol/L (ref 3.5–5.1)
Sodium: 137 mmol/L (ref 135–145)
Total Bilirubin: 0.6 mg/dL (ref 0.3–1.2)
Total Protein: 6.7 g/dL (ref 6.5–8.1)

## 2020-12-10 LAB — CBC WITH DIFFERENTIAL/PLATELET
Abs Immature Granulocytes: 0.01 10*3/uL (ref 0.00–0.07)
Basophils Absolute: 0 10*3/uL (ref 0.0–0.1)
Basophils Relative: 0 %
Eosinophils Absolute: 0.1 10*3/uL (ref 0.0–0.5)
Eosinophils Relative: 3 %
HCT: 40.6 % (ref 36.0–46.0)
Hemoglobin: 13.5 g/dL (ref 12.0–15.0)
Immature Granulocytes: 0 %
Lymphocytes Relative: 47 %
Lymphs Abs: 2.6 10*3/uL (ref 0.7–4.0)
MCH: 29.5 pg (ref 26.0–34.0)
MCHC: 33.3 g/dL (ref 30.0–36.0)
MCV: 88.6 fL (ref 80.0–100.0)
Monocytes Absolute: 0.4 10*3/uL (ref 0.1–1.0)
Monocytes Relative: 7 %
Neutro Abs: 2.4 10*3/uL (ref 1.7–7.7)
Neutrophils Relative %: 43 %
Platelets: 292 10*3/uL (ref 150–400)
RBC: 4.58 MIL/uL (ref 3.87–5.11)
RDW: 12.5 % (ref 11.5–15.5)
WBC: 5.6 10*3/uL (ref 4.0–10.5)
nRBC: 0 % (ref 0.0–0.2)

## 2020-12-10 LAB — I-STAT BETA HCG BLOOD, ED (MC, WL, AP ONLY): I-stat hCG, quantitative: <5 m[IU]/mL (ref <5)

## 2020-12-10 MED ORDER — ACETAMINOPHEN 500 MG PO TABS
1000.0000 mg | ORAL_TABLET | Freq: Once | ORAL | Status: AC
  Administered 2020-12-10: 1000 mg via ORAL
  Filled 2020-12-10: qty 2

## 2020-12-10 MED ORDER — IOHEXOL 300 MG/ML  SOLN
100.0000 mL | Freq: Once | INTRAMUSCULAR | Status: AC | PRN
  Administered 2020-12-10: 100 mL via INTRAVENOUS

## 2020-12-10 MED ORDER — METHOCARBAMOL 500 MG PO TABS: 500.0000 mg | ORAL_TABLET | Freq: Two times a day (BID) | ORAL | 0 refills | Status: DC
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
## 2020-12-13 ENCOUNTER — Ambulatory Visit (HOSPITAL_COMMUNITY)
Admission: EM | Admit: 2020-12-13 | Discharge: 2020-12-13 | Disposition: A | Discharge status: Home / Self Care | Payer: Medicaid Other | Attending: Family Medicine | Admitting: Family Medicine

## 2020-12-13 ENCOUNTER — Other Ambulatory Visit: Payer: Self-pay

## 2020-12-13 ENCOUNTER — Ambulatory Visit (INDEPENDENT_AMBULATORY_CARE_PROVIDER_SITE_OTHER): Payer: Medicaid Other

## 2020-12-13 ENCOUNTER — Ambulatory Visit (HOSPITAL_COMMUNITY)
Admission: EM | Admit: 2020-12-13 | Discharge: 2020-12-13 | Disposition: A | Discharge status: Home / Self Care | Payer: Medicaid Other

## 2020-12-13 ENCOUNTER — Encounter (HOSPITAL_COMMUNITY): Payer: Self-pay | Admitting: Emergency Medicine

## 2020-12-13 DIAGNOSIS — M25562 Pain in left knee: Secondary | ICD-10-CM

## 2020-12-13 DIAGNOSIS — G44209 Tension-type headache, unspecified, not intractable: Secondary | ICD-10-CM

## 2020-12-13 DIAGNOSIS — M86162 Other acute osteomyelitis, left tibia and fibula: Secondary | ICD-10-CM

## 2020-12-13 DIAGNOSIS — M79662 Pain in left lower leg: Secondary | ICD-10-CM | POA: Diagnosis not present

## 2020-12-13 MED ORDER — DICLOFENAC SODIUM 75 MG PO TBEC
75.0000 mg | DELAYED_RELEASE_TABLET | Freq: Two times a day (BID) | ORAL | 0 refills | Status: DC
Start: 2020-12-13 — End: 2022-11-10

## 2020-12-13 NOTE — Discharge Instructions (Addendum)
For your headache, please seek prompt medical care if: You have: A very bad (severe) headache that is not helped by medicine. Trouble walking or weakness in your arms and legs. Clear or bloody fluid coming from your nose or ears. Changes in your seeing (vision). Jerky movements that you cannot control (seizure). You throw up (vomit). You lose balance. Your speech is slurred. You pass out. You are sleepier and have trouble staying awake. The black centers of your eyes (pupils) change in size.  These symptoms may be an emergency. Do not wait to see if the symptoms will go away. Get medical help right away. Call your local emergency services. Do not drive yourself to the hospital.
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
## 2021-07-05 ENCOUNTER — Other Ambulatory Visit: Payer: Self-pay

## 2021-07-05 ENCOUNTER — Ambulatory Visit (HOSPITAL_COMMUNITY)
Admission: EM | Admit: 2021-07-05 | Discharge: 2021-07-05 | Disposition: A | Discharge status: Home / Self Care | Payer: Medicaid Other | Attending: Emergency Medicine | Admitting: Emergency Medicine

## 2021-07-05 ENCOUNTER — Encounter (HOSPITAL_COMMUNITY): Payer: Self-pay

## 2021-07-05 DIAGNOSIS — J111 Influenza due to unidentified influenza virus with other respiratory manifestations: Secondary | ICD-10-CM

## 2021-07-05 MED ORDER — AMOXICILLIN-POT CLAVULANATE 875-125 MG PO TABS: 1.0000 | ORAL_TABLET | Freq: Two times a day (BID) | ORAL | 0 refills | Status: DC

## 2021-07-05 MED ORDER — FLUTICASONE PROPIONATE 50 MCG/ACT NA SUSP: 1.0000 | Freq: Every day | NASAL | 0 refills | Status: DC

## 2021-07-05 MED ORDER — IBUPROFEN 800 MG PO TABS: 800.0000 mg | ORAL_TABLET | Freq: Three times a day (TID) | ORAL | 0 refills | Status: DC

## 2021-07-05 MED ORDER — CYCLOBENZAPRINE HCL 10 MG PO TABS: 10.0000 mg | ORAL_TABLET | Freq: Every day | ORAL | 0 refills | Status: DC

## 2021-07-05 MED ORDER — LIDOCAINE VISCOUS HCL 2 % MT SOLN: 15.0000 mL | OROMUCOSAL | 0 refills | Status: DC | PRN

## 2021-07-05 MED ORDER — DM-GUAIFENESIN ER 30-600 MG PO TB12: 2.0000 | ORAL_TABLET | Freq: Two times a day (BID) | ORAL | 0 refills | Status: DC

## 2021-07-05 NOTE — ED Provider Notes (Signed)
[place]    CSN: [id number] Arrival date & time: 07/05/21  1039      History   Chief Complaint Chief Complaint  Patient presents with   Cough   Nasal Congestion    HPI Nakirah Silverstone is a 25 y.o. female.   Presents with fever, chills, nasal congestion, rhinorrhea, intermittent generalized headaches and nonproductive cough for 4 days.  Known sick contacts for flu. Attempted robitussin and tylenol,  somewhat helpful. No pertinent medical history.    Past Medical History:  Diagnosis Date   Medical history non-contributory     Patient Active Problem List   Diagnosis Date Noted   Normal labor 02/05/2020   Full-term premature rupture of membranes with onset of labor within 24 hours of rupture 03/15/2017    Past Surgical History:  Procedure Laterality Date   NO PAST SURGERIES      OB History     Gravida  2   Para  2   Term  2   Preterm  0   AB  0   Living  2      SAB  0   IAB  0   Ectopic  0   Multiple  0   Live Births  2            Home Medications    Prior to Admission medications   Medication Sig Start Date End Date Taking? Authorizing Provider  diclofenac (VOLTAREN) 75 MG EC tablet Take 1 tablet (75 mg total) by mouth 2 (two) times daily. 12/13/20   Vanessa Kick, MD  FEROSUL 325 (65 Fe) MG tablet Take 325 mg by mouth daily. 11/09/19   [provider]  loperamide (IMODIUM) 2 MG capsule Take 1 capsule (2 mg total) by mouth 2 (two) times daily as needed for diarrhea or loose stools. 06/05/20   Jaynee Eagles, PA-C  methocarbamol (ROBAXIN) 500 MG tablet Take 1 tablet (500 mg total) by mouth 2 (two) times daily. 12/10/20   Alfredia Client, PA-C  ondansetron (ZOFRAN-ODT) 8 MG disintegrating tablet Take 1 tablet (8 mg total) by mouth every 8 (eight) hours as needed for nausea or vomiting. 06/05/20   Jaynee Eagles, PA-C  Prenatal Vit-Fe Fumarate-FA (PRENATAL COMPLETE) 14-0.4 MG TABS Take 1 tablet by mouth daily. Patient not taking:  Reported on 02/07/2020 06/10/19   Larene Pickett, PA-C    Family History Family History  Problem Relation Age of Onset   Hypertension Mother    Diabetes Mother        boderline   Stroke Maternal Grandmother     Social History Social History   Tobacco Use   Smoking status: Never   Smokeless tobacco: Never  Substance Use Topics   Alcohol use: No   Drug use: No     Allergies   Sulfa antibiotics   Review of Systems Review of Systems  Constitutional:  Positive for chills and fever. Negative for activity change, appetite change, diaphoresis, fatigue and unexpected weight change.  HENT:  Positive for congestion and rhinorrhea. Negative for dental problem, drooling, ear discharge, ear pain, facial swelling, hearing loss, mouth sores, nosebleeds, postnasal drip, sinus pressure, sinus pain, sneezing, sore throat, tinnitus, trouble swallowing and voice change.   Respiratory:  Positive for cough. Negative for apnea, choking, chest tightness, shortness of breath, wheezing and stridor.   Cardiovascular: Negative.   Gastrointestinal:  Positive for nausea. Negative for abdominal distention, abdominal pain, anal bleeding, blood in stool, constipation, diarrhea, rectal pain and vomiting.  Skin: Negative.   Neurological:  Positive for headaches. Negative for dizziness, tremors, seizures, syncope, facial asymmetry, speech difficulty, weakness, light-headedness and numbness.    Physical Exam Triage Vital Signs ED Triage Vitals  Enc Vitals Group     BP 07/05/21 1235 100/70     Pulse Rate 07/05/21 1235 (!) 55     Resp 07/05/21 1235 16     Temp 07/05/21 1235 98.8 F (37.1 C)     Temp Source 07/05/21 1235 Oral     SpO2 07/05/21 1235 100 %     Weight --      Height --      Head Circumference --      Peak Flow --      Pain Score 07/05/21 1236 0     Pain Loc --      Pain Edu? --      Excl. in GC? --    No data found.  Updated Vital Signs BP 100/70 (BP Location: Right Arm)   Pulse (!)  55   Temp 98.8 F (37.1 C) (Oral)   Resp 16   LMP 07/05/2021   SpO2 100%   Visual Acuity Right Eye Distance:   Left Eye Distance:   Bilateral Distance:    Right Eye Near:   Left Eye Near:    Bilateral Near:     Physical Exam Constitutional:      Appearance: Normal appearance. She is normal weight.  HENT:     Head: Normocephalic.     Right Ear: Tympanic membrane, ear canal and external ear normal.     Left Ear: Tympanic membrane, ear canal and external ear normal.     Nose: Congestion and rhinorrhea present.     Right Turbinates: Swollen.     Left Turbinates: Not swollen.     Right Sinus: Maxillary sinus tenderness present. No frontal sinus tenderness.     Left Sinus: Maxillary sinus tenderness present. No frontal sinus tenderness.     Mouth/Throat:     Mouth: Mucous membranes are moist.     Pharynx: Oropharynx is clear. Posterior oropharyngeal erythema present.  Eyes:     Extraocular Movements: Extraocular movements intact.  Cardiovascular:     Rate and Rhythm: Normal rate and regular rhythm.     Pulses: Normal pulses.     Heart sounds: Normal heart sounds.  Pulmonary:     Effort: Pulmonary effort is normal.     Breath sounds: Normal breath sounds.  Musculoskeletal:     Cervical back: Normal range of motion and neck supple.  Skin:    General: Skin is warm and dry.  Neurological:     General: No focal deficit present.     Mental Status: She is alert and oriented to person, place, and time. Mental status is at baseline.  Psychiatric:        Mood and Affect: Mood normal.        Behavior: Behavior normal.     UC Treatments / Results  Labs (all labs ordered are listed, but only abnormal results are displayed) Labs Reviewed - No data to display  EKG   Radiology No results found.  Procedures Procedures (including critical care time)  Medications Ordered in UC Medications - No data to display  Initial Impression / Assessment and Plan / UC Course  I have  reviewed the triage vital signs and the nursing notes.  Pertinent labs & imaging results that were available during my care of the patient were reviewed  by me and considered in my medical decision making (see chart for details).  Influenza-like illness  past window of antiviral use, discussed with patient, will continue to manage conservatively  1.  Lidocaine viscous 2% 15 mils every 4 hours 2.  Ibuprofen 3. Flexeril 10 mg at bedtime as needed 4.  Continue use of over-the-counter medications for remaining symptom management 5.  Urgent care follow-up as needed 6. Work note given  Final Clinical Impressions(s) / UC Diagnoses   Final diagnoses:  None   Discharge Instructions   None    ED Prescriptions   None    PDMP not reviewed this encounter.   Hans Eden, NP 07/05/21 1322

## 2021-07-05 NOTE — Discharge Instructions (Signed)
You can take Tylenol and/or Ibuprofen as needed for fever reduction and pain relief.   For cough: honey 1/2 to 1 teaspoon (you can dilute the honey in water or another fluid).  You can also use guaifenesin and dextromethorphan for cough. You can use a humidifier for chest congestion and cough.  If you don't have a humidifier, you can sit in the bathroom with the hot shower running.      For sore throat: try warm salt water gargles, cepacol lozenges, throat spray, warm tea or water with lemon/honey, popsicles or ice, or OTC cold relief medicine for throat discomfort.   For congestion: take a daily anti-histamine like Zyrtec, Claritin, and a oral decongestant, such as pseudoephedrine.  You can also use Flonase 1-2 sprays in each nostril daily.   It is important to stay hydrated: drink plenty of fluids (water, gatorade/powerade/pedialyte, juices, or teas) to keep your throat moisturized and help further relieve irritation/discomfort.   

## 2021-07-05 NOTE — ED Triage Notes (Signed)
Pt presents to the office today for cough,congestion,body aches and sore throat x 3-4 days.

## 2022-01-07 ENCOUNTER — Encounter (HOSPITAL_COMMUNITY): Payer: Self-pay | Admitting: Emergency Medicine

## 2022-01-07 ENCOUNTER — Other Ambulatory Visit: Payer: Self-pay

## 2022-01-07 ENCOUNTER — Emergency Department (HOSPITAL_COMMUNITY)
Admission: EM | Admit: 2022-01-07 | Discharge: 2022-01-07 | Disposition: A | Discharge status: Home / Self Care | Payer: Medicaid Other | Attending: Emergency Medicine | Admitting: Emergency Medicine

## 2022-01-07 DIAGNOSIS — Z202 Contact with and (suspected) exposure to infections with a predominantly sexual mode of transmission: Secondary | ICD-10-CM | POA: Insufficient documentation

## 2022-01-07 DIAGNOSIS — N898 Other specified noninflammatory disorders of vagina: Secondary | ICD-10-CM | POA: Diagnosis present

## 2022-01-07 DIAGNOSIS — R8289 Other abnormal findings on cytological and histological examination of urine: Secondary | ICD-10-CM | POA: Insufficient documentation

## 2022-01-07 LAB — URINALYSIS, ROUTINE W REFLEX MICROSCOPIC
Bilirubin Urine: NEGATIVE
Glucose, UA: NEGATIVE mg/dL
Hgb urine dipstick: NEGATIVE
Ketones, ur: 5 mg/dL — AB
Nitrite: NEGATIVE
Protein, ur: NEGATIVE mg/dL
Specific Gravity, Urine: 1.026 (ref 1.005–1.030)
Squamous Epithelial / HPF: >50 — ABNORMAL HIGH (ref 0–5)
pH: 5 (ref 5.0–8.0)

## 2022-01-07 LAB — WET PREP, GENITAL
Clue Cells Wet Prep HPF POC: NONE SEEN
Sperm: NONE SEEN
Trich, Wet Prep: NONE SEEN
WBC, Wet Prep HPF POC: <10 (ref <10)
Yeast Wet Prep HPF POC: NONE SEEN

## 2022-01-07 LAB — PREGNANCY, URINE: Preg Test, Ur: NEGATIVE

## 2022-01-07 MED ORDER — LIDOCAINE HCL (PF) 1 % IJ SOLN
1.0000 mL | Freq: Once | INTRAMUSCULAR | Status: AC
  Administered 2022-01-07: 1 mL
  Filled 2022-01-07: qty 5

## 2022-01-07 MED ORDER — DOXYCYCLINE HYCLATE 100 MG PO TABS
100.0000 mg | ORAL_TABLET | Freq: Once | ORAL | Status: AC
  Administered 2022-01-07: 100 mg via ORAL
  Filled 2022-01-07: qty 1

## 2022-01-07 MED ORDER — DOXYCYCLINE HYCLATE 100 MG PO CAPS: 100.0000 mg | ORAL_CAPSULE | Freq: Two times a day (BID) | ORAL | 0 refills | Status: DC

## 2022-01-07 MED ORDER — CEFTRIAXONE SODIUM 500 MG IJ SOLR
500.0000 mg | Freq: Once | INTRAMUSCULAR | Status: AC
  Administered 2022-01-07: 500 mg via INTRAMUSCULAR
  Filled 2022-01-07: qty 500

## 2022-01-07 NOTE — ED Provider Triage Note (Signed)
Emergency Medicine Provider Triage Evaluation Note  Sofya Moustafa , a 26 y.o. female  was evaluated in triage.  Pt complains of vaginal itching  Review of Systems  Positive: Possible std risk Negative:   Physical Exam  BP (!) 130/94 (BP Location: Right Arm)   Pulse 90   Temp 98.9 F (37.2 C) (Oral)   Resp 18   SpO2 99%  Gen:   Awake, no distress  no fever Resp:  Normal effort  MSK:   Moves extremities without difficulty  Other:    Medical Decision Making  Medically screening exam initiated at 5:15 PM.  Appropriate orders placed.  Shannel Zahm was informed that the remainder of the evaluation will be completed by another provider, this initial triage assessment does not replace that evaluation, and the importance of remaining in the ED until their evaluation is complete.     Elson Areas, [place] 01/07/22 [phone]

## 2022-01-07 NOTE — ED Triage Notes (Signed)
Patient here for STD evaluation, patient complains of vaginal itching that started one week ago. Denies abnormal vaginal discharge. Patient states she was recently told that one of her sexual partners was positive for a STI but patient is unsure of which one.

## 2022-01-07 NOTE — ED Provider Notes (Signed)
10:36 PM Pelvic completed by myself.  Patient has copious amounts of white discharge.  Mild tenderness.  No lesions.  No cervical motion tenderness.  Gonorrhea and Chlamydia testing sent.  Patient requesting be prophylactically treated.  Rocephin and doxycycline given in ED.  Patient recommended for close follow-up with MyChart account in the next 24 to 48 hours.  Recommended for prompt follow-up with primary care physician if positive.  Wet mount pending.   Edwin Dada P, DO 01/08/22 1559

## 2022-01-07 NOTE — Discharge Instructions (Signed)
Like we discussed, I am prescribing you an antibiotic called doxycycline.  Your first dose was given in the emergency department.  Please take this twice per day until it is completely gone.  Please refrain from sexual intercourse until you have completed the antibiotic.  Please check the results of your gonorrhea and Chlamydia test on MyChart.  This should be available in the next 2 to 3 days.  Please be aware that you been treated for both gonorrhea and chlamydia so you will not need to return to the emergency department for treatment if either test is positive.  Please continue to monitor your symptoms closely and return to the emergency department with any new or worsening symptoms.

## 2022-01-07 NOTE — ED Provider Notes (Signed)
[facility] EMERGENCY DEPARTMENT Provider Note   CSN: [id number] Arrival date & time: 01/07/22  1608     History  Chief Complaint  Patient presents with   Exposure to STD    Heather Paul is a 26 y.o. female.  HPI  Patient is a 26 year old female who presents to the emergency department due to vaginal irritation.  States her symptoms started about 1 week ago.  Reports itching in the vulvar region.  Denies any vaginal discharge, dysuria, hematuria, rashes, lesions.  She also notes that she was told that a previous sexual partner of hers tested positive for an STD but she is unsure which STD he tested positive for.  She states that she always uses condoms during intercourse.     Home Medications Prior to Admission medications   Medication Sig Start Date End Date Taking? Authorizing Provider  doxycycline (VIBRAMYCIN) 100 MG capsule Take 1 capsule (100 mg total) by mouth 2 (two) times daily. 01/07/22  Yes Placido Sou, PA-C  amoxicillin-clavulanate (AUGMENTIN) 875-125 MG tablet Take 1 tablet by mouth every 12 (twelve) hours. 07/09/21   Valinda Hoar, NP  cyclobenzaprine (FLEXERIL) 10 MG tablet Take 1 tablet (10 mg total) by mouth at bedtime. 07/05/21   White, Elita Boone, NP  dextromethorphan-guaiFENesin (MUCINEX DM) 30-600 MG 12hr tablet Take 2 tablets by mouth 2 (two) times daily. 07/05/21   Valinda Hoar, NP  diclofenac (VOLTAREN) 75 MG EC tablet Take 1 tablet (75 mg total) by mouth 2 (two) times daily. 12/13/20   Mardella Layman, MD  FEROSUL 325 (65 Fe) MG tablet Take 325 mg by mouth daily. 11/09/19   [provider]  fluticasone (FLONASE) 50 MCG/ACT nasal spray Place 1 spray into both nostrils daily. 07/05/21   White, Elita Boone, NP  ibuprofen (ADVIL) 800 MG tablet Take 1 tablet (800 mg total) by mouth 3 (three) times daily. 07/05/21   White, Elita Boone, NP  ibuprofen (ADVIL) 800 MG tablet Take 1 tablet (800 mg total) by mouth 3 (three) times daily.  07/05/21   White, Elita Boone, NP  lidocaine (XYLOCAINE) 2 % solution Use as directed 15 mLs in the mouth or throat as needed for mouth pain. 07/05/21   Valinda Hoar, NP  loperamide (IMODIUM) 2 MG capsule Take 1 capsule (2 mg total) by mouth 2 (two) times daily as needed for diarrhea or loose stools. 06/05/20   Wallis Bamberg, PA-C  methocarbamol (ROBAXIN) 500 MG tablet Take 1 tablet (500 mg total) by mouth 2 (two) times daily. 12/10/20   Farrel Gordon, PA-C  ondansetron (ZOFRAN-ODT) 8 MG disintegrating tablet Take 1 tablet (8 mg total) by mouth every 8 (eight) hours as needed for nausea or vomiting. 06/05/20   Wallis Bamberg, PA-C  Prenatal Vit-Fe Fumarate-FA (PRENATAL COMPLETE) 14-0.4 MG TABS Take 1 tablet by mouth daily. Patient not taking: Reported on 02/07/2020 06/10/19   Garlon Hatchet, PA-C      Allergies    Sulfa antibiotics    Review of Systems   Review of Systems  All other systems reviewed and are negative. Ten systems reviewed and are negative for acute change, except as noted in the HPI.   Physical Exam Updated Vital Signs BP 126/67 (BP Location: Right Arm)   Pulse 76   Temp 97.8 F (36.6 C) (Oral)   Resp 18   SpO2 98%  Physical Exam Vitals and nursing note reviewed.  Constitutional:      General: She is not in acute  distress.    Appearance: Normal appearance. She is not ill-appearing, toxic-appearing or diaphoretic.  HENT:     Head: Normocephalic and atraumatic.     Right Ear: External ear normal.     Left Ear: External ear normal.     Nose: Nose normal.     Mouth/Throat:     Mouth: Mucous membranes are moist.     Pharynx: Oropharynx is clear. No oropharyngeal exudate or posterior oropharyngeal erythema.  Eyes:     Extraocular Movements: Extraocular movements intact.  Cardiovascular:     Rate and Rhythm: Normal rate.     Pulses: Normal pulses.  Pulmonary:     Effort: Pulmonary effort is normal. No respiratory distress.     Breath sounds: No stridor.  Abdominal:      General: Abdomen is flat.     Palpations: Abdomen is soft.     Tenderness: There is no abdominal tenderness.     Comments: Abdomen is flat, soft, and nontender in all 4 quadrants.  Genitourinary:    Comments: Pelvic exam performed by my attending physician Dr. Wallace CullensGray.  Please see her note for additional information. Musculoskeletal:        General: Normal range of motion.     Cervical back: Normal range of motion and neck supple. No tenderness.  Skin:    General: Skin is warm and dry.  Neurological:     General: No focal deficit present.     Mental Status: She is alert and oriented to person, place, and time.  Psychiatric:        Mood and Affect: Mood normal.        Behavior: Behavior normal.   ED Results / Procedures / Treatments   Labs (all labs ordered are listed, but only abnormal results are displayed) Labs Reviewed  URINALYSIS, ROUTINE W REFLEX MICROSCOPIC - Abnormal; Notable for the following components:      Result Value   APPearance CLOUDY (*)    Ketones, ur 5 (*)    Leukocytes,Ua LARGE (*)    Bacteria, UA RARE (*)    Squamous Epithelial / LPF >50 (*)    Non Squamous Epithelial 0-5 (*)    All other components within normal limits  WET PREP, GENITAL  PREGNANCY, URINE  GC/CHLAMYDIA PROBE AMP ([place]    EKG None  Radiology No results found.  Procedures Procedures   Medications Ordered in ED Medications  cefTRIAXone (ROCEPHIN) injection 500 mg (500 mg Intramuscular Given 01/07/22 2247)  lidocaine (PF) (XYLOCAINE) 1 % injection 1-2.1 mL (1 mL Other Given 01/07/22 2247)  doxycycline (VIBRA-TABS) tablet 100 mg (100 mg Oral Given 01/07/22 2246)    ED Course/ Medical Decision Making/ A&P                           Medical Decision Making Risk Prescription drug management.   Pt is a 26 y.o. female who presents to the emergency department due to vaginal irritation.  Please see HPI above for additional information.  Labs: UA with 5 ketones, large  leukocytes, 6-10 red blood cells, 21-50 white blood cells, rare bacteria, greater than 50 squamous epithelial cells, 0-5 non-squamous epithelial cells. Pregnancy test is negative. Wet prep without abnormalities.  I, Placido SouLogan Ebenezer Mccaskey, PA-C, personally reviewed and evaluated these images and lab results as part of my medical decision-making.  On my exam patient is afebrile, nontachycardic, and nontoxic-appearing.  Abdomen is soft and nontender in all 4  quadrants.  Pelvic exam performed by my attending physician Dr. Wallace Cullens.  Please see her note for additional information.  Wet prep appears reassuring.  UA with findings as noted above.  Significant amount of squamous and non-squamous epithelial cells.  Patient denies any urinary complaints.   Patient elected to be treated prophylactically for STI.  IM Rocephin given in the ED.  Will discharge on a course of doxycycline.  Patient understands to refrain from sex until she completes her antibiotics.  Discussed safe sex practices.  Discussed return precautions.  Her questions were answered and she was amicable at the time of discharge.  Note: Portions of this report may have been transcribed using voice recognition software. Every effort was made to ensure accuracy; however, inadvertent computerized transcription errors may be present.   Final Clinical Impression(s) / ED Diagnoses Final diagnoses:  Possible exposure to STD   Rx / DC Orders ED Discharge Orders          Ordered    doxycycline (VIBRAMYCIN) 100 MG capsule  2 times daily        01/07/22 2309              Placido Sou, PA-C 01/07/22 2325    Edwin Dada P, DO 01/08/22 1304

## 2022-01-08 LAB — GC/CHLAMYDIA PROBE AMP (~~LOC~~) NOT AT ARMC
Chlamydia: NEGATIVE
Comment: NEGATIVE
Comment: NORMAL
Neisseria Gonorrhea: NEGATIVE

## 2022-03-10 ENCOUNTER — Telehealth: Payer: Medicaid Other | Admitting: Nurse Practitioner

## 2022-03-10 DIAGNOSIS — N9089 Other specified noninflammatory disorders of vulva and perineum: Secondary | ICD-10-CM | POA: Diagnosis not present

## 2022-03-10 NOTE — Patient Instructions (Signed)
[name], thank you for joining Claiborne Rigg, NP for today's virtual visit.  While this provider is not your primary care provider (PCP), if your PCP is located in our provider database this encounter information will be shared with them immediately following your visit.  Consent: (Patient) Heather Paul provided verbal consent for this virtual visit at the beginning of the encounter.  Current Medications:  Current Outpatient Medications:    amoxicillin-clavulanate (AUGMENTIN) 875-125 MG tablet, Take 1 tablet by mouth every 12 (twelve) hours., Disp: 14 tablet, Rfl: 0   cyclobenzaprine (FLEXERIL) 10 MG tablet, Take 1 tablet (10 mg total) by mouth at bedtime., Disp: 10 tablet, Rfl: 0   dextromethorphan-guaiFENesin (MUCINEX DM) 30-600 MG 12hr tablet, Take 2 tablets by mouth 2 (two) times daily., Disp: 40 tablet, Rfl: 0   diclofenac (VOLTAREN) 75 MG EC tablet, Take 1 tablet (75 mg total) by mouth 2 (two) times daily., Disp: 14 tablet, Rfl: 0   doxycycline (VIBRAMYCIN) 100 MG capsule, Take 1 capsule (100 mg total) by mouth 2 (two) times daily., Disp: 13 capsule, Rfl: 0   FEROSUL 325 (65 Fe) MG tablet, Take 325 mg by mouth daily., Disp: , Rfl:    fluticasone (FLONASE) 50 MCG/ACT nasal spray, Place 1 spray into both nostrils daily., Disp: 11.1 mL, Rfl: 0   ibuprofen (ADVIL) 800 MG tablet, Take 1 tablet (800 mg total) by mouth 3 (three) times daily., Disp: 21 tablet, Rfl: 0   ibuprofen (ADVIL) 800 MG tablet, Take 1 tablet (800 mg total) by mouth 3 (three) times daily., Disp: 21 tablet, Rfl: 0   lidocaine (XYLOCAINE) 2 % solution, Use as directed 15 mLs in the mouth or throat as needed for mouth pain., Disp: 100 mL, Rfl: 0   loperamide (IMODIUM) 2 MG capsule, Take 1 capsule (2 mg total) by mouth 2 (two) times daily as needed for diarrhea or loose stools., Disp: 10 capsule, Rfl: 0   methocarbamol (ROBAXIN) 500 MG tablet, Take 1 tablet (500 mg total) by mouth 2 (two) times daily., Disp: 20  tablet, Rfl: 0   ondansetron (ZOFRAN-ODT) 8 MG disintegrating tablet, Take 1 tablet (8 mg total) by mouth every 8 (eight) hours as needed for nausea or vomiting., Disp: 20 tablet, Rfl: 0   Prenatal Vit-Fe Fumarate-FA (PRENATAL COMPLETE) 14-0.4 MG TABS, Take 1 tablet by mouth daily. (Patient not taking: Reported on 02/07/2020), Disp: 60 tablet, Rfl: 0   Medications ordered in this encounter:  No orders of the defined types were placed in this encounter.    *If you need refills on other medications prior to your next appointment, please contact your pharmacy*  Follow-Up: Call back or seek an in-person evaluation if the symptoms worsen or if the condition fails to improve as anticipated.  Other Instructions Need to be seen in person for vaginal exam.    If you have been instructed to have an in-person evaluation today at a local Urgent Care facility, please use the link below. It will take you to a list of all of our available [facility], including address, phone number and hours of operation. Please do not delay care.  [facility]  If you or a family member do not have a primary care provider, use the link below to schedule a visit and establish care. When you choose a  primary care physician or advanced practice provider, you gain a long-term partner in health. Find a Primary Care Provider  Learn more about [facility]'s in-office and virtual care options: [facility]

## 2022-03-10 NOTE — Progress Notes (Signed)
Virtual Visit Consent   Heather Paul, you are scheduled for a virtual visit with a [facility] provider today. Just as with appointments in the office, your consent must be obtained to participate. Your consent will be active for this visit and any virtual visit you may have with one of our providers in the next 365 days. If you have a MyChart account, a copy of this consent can be sent to you electronically.  As this is a virtual visit, video technology does not allow for your provider to perform a traditional examination. This may limit your provider's ability to fully assess your condition. If your provider identifies any concerns that need to be evaluated in person or the need to arrange testing (such as labs, EKG, etc.), we will make arrangements to do so. Although advances in technology are sophisticated, we cannot ensure that it will always work on either your end or our end. If the connection with a video visit is poor, the visit may have to be switched to a telephone visit. With either a video or telephone visit, we are not always able to ensure that we have a secure connection.  By engaging in this virtual visit, you consent to the provision of healthcare and authorize for your insurance to be billed (if applicable) for the services provided during this visit. Depending on your insurance coverage, you may receive a charge related to this service.  I need to obtain your verbal consent now. Are you willing to proceed with your visit today? Heather Paul has provided verbal consent on 03/10/2022 for a virtual visit (video or telephone). Claiborne Rigg, NP  Date: 03/10/2022 1:07 PM  Virtual Visit via Video Note   I, Claiborne Rigg, connected with  Heather Paul  ([id number], 1995/12/26) on 03/10/22 at  1:00 PM EDT by a video-enabled telemedicine application and verified that I am speaking with the correct person using two identifiers.  Location: Patient: Virtual Visit Location Patient:  Home Provider: Virtual Visit Location Provider: Home Office   I discussed the limitations of evaluation and management by telemedicine and the availability of in person appointments. The patient expressed understanding and agreed to proceed.    History of Present Illness: Heather Paul is a 26 y.o. who identifies as a female who was assigned female at birth, and is being seen today for vulvar irritation.  Notes ongoing vulvar irritation for the past several weeks. Not sexually active currently and STD testing negative 01-2022. She has not shaved her pubic hairs in a while and reports last time she removed her pubic hairs she used nair and it caused skin irritation so she has not used this product since. Denies any lesions, rash, or unusual vaginal discharge. She does state the appearance of her pubic hairs has changed and hairs seem to be tighter and more coiled. I did recommend she tweeze any ingrown hairs that she may have.   Problems:  Patient Active Problem List   Diagnosis Date Noted   Normal labor 02/05/2020   Full-term premature rupture of membranes with onset of labor within 24 hours of rupture 03/15/2017    Allergies:  Allergies  Allergen Reactions   Sulfa Antibiotics Palpitations   Medications:  Current Outpatient Medications:    amoxicillin-clavulanate (AUGMENTIN) 875-125 MG tablet, Take 1 tablet by mouth every 12 (twelve) hours., Disp: 14 tablet, Rfl: 0   cyclobenzaprine (FLEXERIL) 10 MG tablet, Take 1 tablet (10 mg total) by mouth at bedtime., Disp: 10 tablet, Rfl: 0  dextromethorphan-guaiFENesin (MUCINEX DM) 30-600 MG 12hr tablet, Take 2 tablets by mouth 2 (two) times daily., Disp: 40 tablet, Rfl: 0   diclofenac (VOLTAREN) 75 MG EC tablet, Take 1 tablet (75 mg total) by mouth 2 (two) times daily., Disp: 14 tablet, Rfl: 0   doxycycline (VIBRAMYCIN) 100 MG capsule, Take 1 capsule (100 mg total) by mouth 2 (two) times daily., Disp: 13 capsule, Rfl: 0   FEROSUL 325 (65 Fe) MG  tablet, Take 325 mg by mouth daily., Disp: , Rfl:    fluticasone (FLONASE) 50 MCG/ACT nasal spray, Place 1 spray into both nostrils daily., Disp: 11.1 mL, Rfl: 0   ibuprofen (ADVIL) 800 MG tablet, Take 1 tablet (800 mg total) by mouth 3 (three) times daily., Disp: 21 tablet, Rfl: 0   ibuprofen (ADVIL) 800 MG tablet, Take 1 tablet (800 mg total) by mouth 3 (three) times daily., Disp: 21 tablet, Rfl: 0   lidocaine (XYLOCAINE) 2 % solution, Use as directed 15 mLs in the mouth or throat as needed for mouth pain., Disp: 100 mL, Rfl: 0   loperamide (IMODIUM) 2 MG capsule, Take 1 capsule (2 mg total) by mouth 2 (two) times daily as needed for diarrhea or loose stools., Disp: 10 capsule, Rfl: 0   methocarbamol (ROBAXIN) 500 MG tablet, Take 1 tablet (500 mg total) by mouth 2 (two) times daily., Disp: 20 tablet, Rfl: 0   ondansetron (ZOFRAN-ODT) 8 MG disintegrating tablet, Take 1 tablet (8 mg total) by mouth every 8 (eight) hours as needed for nausea or vomiting., Disp: 20 tablet, Rfl: 0   Prenatal Vit-Fe Fumarate-FA (PRENATAL COMPLETE) 14-0.4 MG TABS, Take 1 tablet by mouth daily. (Patient not taking: Reported on 02/07/2020), Disp: 60 tablet, Rfl: 0  Observations/Objective: Patient is well-developed, well-nourished in no acute distress.  Resting comfortably  at home.  Head is normocephalic, atraumatic.  No labored breathing.  Speech is clear and coherent with logical content.  Patient is alert and oriented at baseline.    Assessment and Plan: 1. Vulvar irritation Needs to be seen in person for vaginal exam  Follow Up Instructions: I discussed the assessment and treatment plan with the patient. The patient was provided an opportunity to ask questions and all were answered. The patient agreed with the plan and demonstrated an understanding of the instructions.  A copy of instructions were sent to the patient via MyChart unless otherwise noted below.   The patient was advised to call back or seek an  in-person evaluation if the symptoms worsen or if the condition fails to improve as anticipated.  Time:  I spent 10 minutes with the patient via telehealth technology discussing the above problems/concerns.    Claiborne Rigg, NP

## 2022-04-03 ENCOUNTER — Telehealth: Payer: Medicaid Other | Admitting: Family Medicine

## 2022-04-03 DIAGNOSIS — B9789 Other viral agents as the cause of diseases classified elsewhere: Secondary | ICD-10-CM

## 2022-04-03 DIAGNOSIS — J019 Acute sinusitis, unspecified: Secondary | ICD-10-CM | POA: Diagnosis not present

## 2022-04-03 MED ORDER — FLUTICASONE PROPIONATE 50 MCG/ACT NA SUSP: 2.0000 | Freq: Every day | NASAL | 0 refills | Status: DC

## 2022-04-03 MED ORDER — BENZONATATE 100 MG PO CAPS: 100.0000 mg | ORAL_CAPSULE | Freq: Two times a day (BID) | ORAL | 0 refills | Status: DC | PRN

## 2022-04-03 NOTE — Progress Notes (Signed)
Virtual Visit Consent   Heather Paul, you are scheduled for a virtual visit with a [facility] provider today. Just as with appointments in the office, your consent must be obtained to participate. Your consent will be active for this visit and any virtual visit you may have with one of our providers in the next 365 days. If you have a MyChart account, a copy of this consent can be sent to you electronically.  As this is a virtual visit, video technology does not allow for your provider to perform a traditional examination. This may limit your provider's ability to fully assess your condition. If your provider identifies any concerns that need to be evaluated in person or the need to arrange testing (such as labs, EKG, etc.), we will make arrangements to do so. Although advances in technology are sophisticated, we cannot ensure that it will always work on either your end or our end. If the connection with a video visit is poor, the visit may have to be switched to a telephone visit. With either a video or telephone visit, we are not always able to ensure that we have a secure connection.  By engaging in this virtual visit, you consent to the provision of healthcare and authorize for your insurance to be billed (if applicable) for the services provided during this visit. Depending on your insurance coverage, you may receive a charge related to this service.  I need to obtain your verbal consent now. Are you willing to proceed with your visit today? Heather Paul has provided verbal consent on 04/03/2022 for a virtual visit (video or telephone). Freddy Finner, NP  Date: 04/03/2022 11:05 AM  Virtual Visit via Video Note   I, Freddy Finner, connected with  Heather Paul  ([id number], 09-04-1995) on 04/03/22 at 11:15 AM EDT by a video-enabled telemedicine application and verified that I am speaking with the correct person using two identifiers.  Location: Patient: Virtual Visit Location Patient:  Home Provider: Virtual Visit Location Provider: Home Office   I discussed the limitations of evaluation and management by telemedicine and the availability of in person appointments. The patient expressed understanding and agreed to proceed.    History of Present Illness: Heather Paul is a 26 y.o. who identifies as a female who was assigned female at birth, and is being seen today for cough -dry and congestion. Started feeling weak on Sunday, symptoms increased in last 3 days. Body aches, headaches and chills, unsure if having a fever. Has not tested for COVID, will get test. Reports daughter is sick as well and she is calling the peds to take her in. Took day/nite quil nothing helping much.   Works at a [employer]  No covid vaccines.    Problems:  Patient Active Problem List   Diagnosis Date Noted   Normal labor 02/05/2020   Full-term premature rupture of membranes with onset of labor within 24 hours of rupture 03/15/2017    Allergies:  Allergies  Allergen Reactions   Sulfa Antibiotics Palpitations   Medications:  Current Outpatient Medications:    amoxicillin-clavulanate (AUGMENTIN) 875-125 MG tablet, Take 1 tablet by mouth every 12 (twelve) hours., Disp: 14 tablet, Rfl: 0   cyclobenzaprine (FLEXERIL) 10 MG tablet, Take 1 tablet (10 mg total) by mouth at bedtime., Disp: 10 tablet, Rfl: 0   dextromethorphan-guaiFENesin (MUCINEX DM) 30-600 MG 12hr tablet, Take 2 tablets by mouth 2 (two) times daily., Disp: 40 tablet, Rfl: 0   diclofenac (VOLTAREN) 75 MG EC tablet,  Take 1 tablet (75 mg total) by mouth 2 (two) times daily., Disp: 14 tablet, Rfl: 0   doxycycline (VIBRAMYCIN) 100 MG capsule, Take 1 capsule (100 mg total) by mouth 2 (two) times daily., Disp: 13 capsule, Rfl: 0   FEROSUL 325 (65 Fe) MG tablet, Take 325 mg by mouth daily., Disp: , Rfl:    fluticasone (FLONASE) 50 MCG/ACT nasal spray, Place 1 spray into both nostrils daily., Disp: 11.1 mL, Rfl: 0   ibuprofen (ADVIL) 800 MG  tablet, Take 1 tablet (800 mg total) by mouth 3 (three) times daily., Disp: 21 tablet, Rfl: 0   ibuprofen (ADVIL) 800 MG tablet, Take 1 tablet (800 mg total) by mouth 3 (three) times daily., Disp: 21 tablet, Rfl: 0   lidocaine (XYLOCAINE) 2 % solution, Use as directed 15 mLs in the mouth or throat as needed for mouth pain., Disp: 100 mL, Rfl: 0   loperamide (IMODIUM) 2 MG capsule, Take 1 capsule (2 mg total) by mouth 2 (two) times daily as needed for diarrhea or loose stools., Disp: 10 capsule, Rfl: 0   methocarbamol (ROBAXIN) 500 MG tablet, Take 1 tablet (500 mg total) by mouth 2 (two) times daily., Disp: 20 tablet, Rfl: 0   ondansetron (ZOFRAN-ODT) 8 MG disintegrating tablet, Take 1 tablet (8 mg total) by mouth every 8 (eight) hours as needed for nausea or vomiting., Disp: 20 tablet, Rfl: 0   Prenatal Vit-Fe Fumarate-FA (PRENATAL COMPLETE) 14-0.4 MG TABS, Take 1 tablet by mouth daily. (Patient not taking: Reported on 02/07/2020), Disp: 60 tablet, Rfl: 0  Observations/Objective: Patient is well-developed, well-nourished in no acute distress.  Resting comfortably  at home.  Head is normocephalic, atraumatic.  No labored breathing.  Speech is clear and coherent with logical content.  Patient is alert and oriented at baseline.    Assessment and Plan: 1. Acute viral sinusitis  - fluticasone (FLONASE) 50 MCG/ACT nasal spray; Place 2 sprays into both nostrils daily.  Dispense: 16 g; Refill: 0 - benzonatate (TESSALON) 100 MG capsule; Take 1 capsule (100 mg total) by mouth 2 (two) times daily as needed for cough.  Dispense: 20 capsule; Refill: 0  Questionable if covid is cause vs another virus Advised testing given exposure with public Discussed symptom management with above and OTC    Reviewed side effects, risks and benefits of medication.    Patient acknowledged agreement and understanding of the plan.   Past Medical, Surgical, Social History, Allergies, and Medications have been  Reviewed.    Follow Up Instructions: I discussed the assessment and treatment plan with the patient. The patient was provided an opportunity to ask questions and all were answered. The patient agreed with the plan and demonstrated an understanding of the instructions.  A copy of instructions were sent to the patient via MyChart unless otherwise noted below.    The patient was advised to call back or seek an in-person evaluation if the symptoms worsen or if the condition fails to improve as anticipated.  Time:  I spent 10 minutes with the patient via telehealth technology discussing the above problems/concerns.    Freddy Finner, NP

## 2022-04-03 NOTE — Patient Instructions (Addendum)
Heather Paul, thank you for joining Freddy Finner, NP for today's virtual visit.  While this provider is not your primary care provider (PCP), if your PCP is located in our provider database this encounter information will be shared with them immediately following your visit.  Consent: (Patient) Heather Paul provided verbal consent for this virtual visit at the beginning of the encounter.  Current Medications:  Current Outpatient Medications:    benzonatate (TESSALON) 100 MG capsule, Take 1 capsule (100 mg total) by mouth 2 (two) times daily as needed for cough., Disp: 20 capsule, Rfl: 0   fluticasone (FLONASE) 50 MCG/ACT nasal spray, Place 2 sprays into both nostrils daily., Disp: 16 g, Rfl: 0   amoxicillin-clavulanate (AUGMENTIN) 875-125 MG tablet, Take 1 tablet by mouth every 12 (twelve) hours., Disp: 14 tablet, Rfl: 0   cyclobenzaprine (FLEXERIL) 10 MG tablet, Take 1 tablet (10 mg total) by mouth at bedtime., Disp: 10 tablet, Rfl: 0   dextromethorphan-guaiFENesin (MUCINEX DM) 30-600 MG 12hr tablet, Take 2 tablets by mouth 2 (two) times daily., Disp: 40 tablet, Rfl: 0   diclofenac (VOLTAREN) 75 MG EC tablet, Take 1 tablet (75 mg total) by mouth 2 (two) times daily., Disp: 14 tablet, Rfl: 0   doxycycline (VIBRAMYCIN) 100 MG capsule, Take 1 capsule (100 mg total) by mouth 2 (two) times daily., Disp: 13 capsule, Rfl: 0   FEROSUL 325 (65 Fe) MG tablet, Take 325 mg by mouth daily., Disp: , Rfl:    ibuprofen (ADVIL) 800 MG tablet, Take 1 tablet (800 mg total) by mouth 3 (three) times daily., Disp: 21 tablet, Rfl: 0   ibuprofen (ADVIL) 800 MG tablet, Take 1 tablet (800 mg total) by mouth 3 (three) times daily., Disp: 21 tablet, Rfl: 0   lidocaine (XYLOCAINE) 2 % solution, Use as directed 15 mLs in the mouth or throat as needed for mouth pain., Disp: 100 mL, Rfl: 0   loperamide (IMODIUM) 2 MG capsule, Take 1 capsule (2 mg total) by mouth 2 (two) times daily as needed for diarrhea or loose  stools., Disp: 10 capsule, Rfl: 0   methocarbamol (ROBAXIN) 500 MG tablet, Take 1 tablet (500 mg total) by mouth 2 (two) times daily., Disp: 20 tablet, Rfl: 0   ondansetron (ZOFRAN-ODT) 8 MG disintegrating tablet, Take 1 tablet (8 mg total) by mouth every 8 (eight) hours as needed for nausea or vomiting., Disp: 20 tablet, Rfl: 0   Prenatal Vit-Fe Fumarate-FA (PRENATAL COMPLETE) 14-0.4 MG TABS, Take 1 tablet by mouth daily. (Patient not taking: Reported on 02/07/2020), Disp: 60 tablet, Rfl: 0   Medications ordered in this encounter:  Meds ordered this encounter  Medications   fluticasone (FLONASE) 50 MCG/ACT nasal spray    Sig: Place 2 sprays into both nostrils daily.    Dispense:  16 g    Refill:  0    Order Specific Question:   Supervising Provider    Answer:   MILLER, BRIAN [[id number]]   benzonatate (TESSALON) 100 MG capsule    Sig: Take 1 capsule (100 mg total) by mouth 2 (two) times daily as needed for cough.    Dispense:  20 capsule    Refill:  0    Order Specific Question:   Supervising Provider    Answer:   Hyacinth Meeker, BRIAN [[id number]]     *If you need refills on other medications prior to your next appointment, please contact your pharmacy*  Follow-Up: Call back or seek an in-person evaluation if the symptoms worsen or  if the condition fails to improve as anticipated.  Other Instructions Upper Respiratory Infection, Adult An upper respiratory infection (URI) is a common viral infection of the nose, throat, and upper air passages that lead to the lungs. The most common type of URI is the common cold. URIs usually get better on their own, without medical treatment. What are the causes? A URI is caused by a virus. You may catch a virus by: Breathing in droplets from an infected person's cough or sneeze. Touching something that has been exposed to the virus (is contaminated) and then touching your mouth, nose, or eyes. What increases the risk? You are more likely to get a URI if: You are  very young or very old. You have close contact with others, such as at work, school, or a health care facility. You smoke. You have long-term (chronic) heart or lung disease. You have a weakened disease-fighting system (immune system). You have nasal allergies or asthma. You are experiencing a lot of stress. You have poor nutrition. What are the signs or symptoms? A URI usually involves some of the following symptoms: Runny or stuffy (congested) nose. Cough. Sneezing. Sore throat. Headache. Fatigue. Fever. Loss of appetite. Pain in your forehead, behind your eyes, and over your cheekbones (sinus pain). Muscle aches. Redness or irritation of the eyes. Pressure in the ears or face. How is this diagnosed? This condition may be diagnosed based on your medical history and symptoms, and a physical exam. Your health care provider may use a swab to take a mucus sample from your nose (nasal swab). This sample can be tested to determine what virus is causing the illness. How is this treated? URIs usually get better on their own within 7-10 days. Medicines cannot cure URIs, but your health care provider may recommend certain medicines to help relieve symptoms, such as: Over-the-counter cold medicines. Cough suppressants. Coughing is a type of defense against infection that helps to clear the respiratory system, so take these medicines only as recommended by your health care provider. Fever-reducing medicines. Follow these instructions at home: Activity Rest as needed. If you have a fever, stay home from work or school until your fever is gone or until your health care provider says your URI cannot spread to other people (is no longer contagious). Your health care provider may have you wear a face mask to prevent your infection from spreading. Relieving symptoms Gargle with a mixture of salt and water 3-4 times a day or as needed. To make salt water, completely dissolve -1 tsp (3-6 g) of salt  in 1 cup (237 mL) of warm water. Use a cool-mist humidifier to add moisture to the air. This can help you breathe more easily. Eating and drinking  Drink enough fluid to keep your urine pale yellow. Eat soups and other clear broths. General instructions  Take over-the-counter and prescription medicines only as told by your health care provider. These include cold medicines, fever reducers, and cough suppressants. Do not use any products that contain nicotine or tobacco. These products include cigarettes, chewing tobacco, and vaping devices, such as e-cigarettes. If you need help quitting, ask your health care provider. Stay away from secondhand smoke. Stay up to date on all immunizations, including the yearly (annual) flu vaccine. Keep all follow-up visits. This is important. How to prevent the spread of infection to others URIs can be contagious. To prevent the infection from spreading: Wash your hands with soap and water for at least 20 seconds. If soap  and water are not available, use hand sanitizer. Avoid touching your mouth, face, eyes, or nose. Cough or sneeze into a tissue or your sleeve or elbow instead of into your hand or into the air.  Contact a health care provider if: You are getting worse instead of better. You have a fever or chills. Your mucus is brown or red. You have yellow or brown discharge coming from your nose. You have pain in your face, especially when you bend forward. You have swollen neck glands. You have pain while swallowing. You have white areas in the back of your throat. Get help right away if: You have shortness of breath that gets worse. You have severe or persistent: Headache. Ear pain. Sinus pain. Chest pain. You have chronic lung disease along with any of the following: Making high-pitched whistling sounds when you breathe, most often when you breathe out (wheezing). Prolonged cough (more than 14 days). Coughing up blood. A change in your  usual mucus. You have a stiff neck. You have changes in your: Vision. Hearing. Thinking. Mood. These symptoms may be an emergency. Get help right away. Call 911. Do not wait to see if the symptoms will go away. Do not drive yourself to the hospital. Summary An upper respiratory infection (URI) is a common infection of the nose, throat, and upper air passages that lead to the lungs. A URI is caused by a virus. URIs usually get better on their own within 7-10 days. Medicines cannot cure URIs, but your health care provider may recommend certain medicines to help relieve symptoms. This information is not intended to replace advice given to you by your health care provider. Make sure you discuss any questions you have with your health care provider. Document Revised: 02/21/2021 Document Reviewed: 02/21/2021 Elsevier Patient Education  2023 [employer]     If you have been instructed to have an in-person evaluation today at a local Urgent Care facility, please use the link below. It will take you to a list of all of our available [facility], including address, phone number and hours of operation. Please do not delay care.  [facility]  If you or a family member do not have a primary care provider, use the link below to schedule a visit and establish care. When you choose a [facility] primary care physician or advanced practice provider, you gain a long-term partner in health. Find a Primary Care Provider  Learn more about [facility]'s in-office and virtual care options: [facility] - Get Care Now

## 2022-11-10 ENCOUNTER — Telehealth: Payer: Medicaid Other | Admitting: Family

## 2022-11-10 DIAGNOSIS — R519 Headache, unspecified: Secondary | ICD-10-CM

## 2022-11-10 MED ORDER — NAPROXEN 500 MG PO TABS: 500.0000 mg | ORAL_TABLET | Freq: Two times a day (BID) | ORAL | 0 refills | Status: DC

## 2022-11-10 MED ORDER — FLUTICASONE PROPIONATE 50 MCG/ACT NA SUSP: 2.0000 | Freq: Every day | NASAL | 6 refills | Status: DC

## 2022-11-10 MED ORDER — CETIRIZINE HCL 10 MG PO TABS: 10.0000 mg | ORAL_TABLET | Freq: Every day | ORAL | 1 refills | Status: DC

## 2022-11-10 NOTE — Progress Notes (Signed)
Virtual Visit Consent   [name], you are scheduled for a virtual visit with a [facility] provider today. Just as with appointments in the office, your consent must be obtained to participate. Your consent will be active for this visit and any virtual visit you may have with one of our providers in the next 365 days. If you have a MyChart account, a copy of this consent can be sent to you electronically.  As this is a virtual visit, video technology does not allow for your provider to perform a traditional examination. This may limit your provider's ability to fully assess your condition. If your provider identifies any concerns that need to be evaluated in person or the need to arrange testing (such as labs, EKG, etc.), we will make arrangements to do so. Although advances in technology are sophisticated, we cannot ensure that it will always work on either your end or our end. If the connection with a video visit is poor, the visit may have to be switched to a telephone visit. With either a video or telephone visit, we are not always able to ensure that we have a secure connection.  By engaging in this virtual visit, you consent to the provision of healthcare and authorize for your insurance to be billed (if applicable) for the services provided during this visit. Depending on your insurance coverage, you may receive a charge related to this service.  I need to obtain your verbal consent now. Are you willing to proceed with your visit today? Heather Paul has provided verbal consent on 11/10/2022 for a virtual visit (video or telephone). Jannifer Rodney, FNP  Date: 11/10/2022 9:09 AM  Virtual Visit via Video Note   I, Jannifer Rodney, connected with  Heather Paul  ([id number], 1995-11-01) on 11/10/22 at  9:00 AM EDT by a video-enabled telemedicine application and verified that I am speaking with the correct person using two identifiers.  Location: Patient: Virtual Visit Location Patient:  Home Provider: Virtual Visit Location Provider: Home Office   I discussed the limitations of evaluation and management by telemedicine and the availability of in person appointments. The patient expressed understanding and agreed to proceed.    History of Present Illness: Heather Paul is a 27 y.o. who identifies as a female who was assigned female at birth, and is being seen today for headache that started yesterday and complaining of sinus pressure.  HPI: Headache  This is a new problem. The current episode started yesterday. The problem occurs constantly. The problem has been unchanged. The pain is located in the Frontal region. The pain is at a severity of 8/10. The pain is mild. Associated symptoms include nausea. Pertinent negatives include no phonophobia, photophobia or vomiting. She has tried Excedrin for the symptoms. The treatment provided mild relief.    Problems:  Patient Active Problem List   Diagnosis Date Noted   Normal labor 02/05/2020   Full-term premature rupture of membranes with onset of labor within 24 hours of rupture 03/15/2017    Allergies:  Allergies  Allergen Reactions   Sulfa Antibiotics Palpitations   Medications:  Current Outpatient Medications:    cetirizine (ZYRTEC ALLERGY) 10 MG tablet, Take 1 tablet (10 mg total) by mouth daily., Disp: 90 tablet, Rfl: 1   fluticasone (FLONASE) 50 MCG/ACT nasal spray, Place 2 sprays into both nostrils daily., Disp: 16 g, Rfl: 6   naproxen (NAPROSYN) 500 MG tablet, Take 1 tablet (500 mg total) by mouth 2 (two) times daily with a meal.,  Disp: 30 tablet, Rfl: 0   ibuprofen (ADVIL) 800 MG tablet, Take 1 tablet (800 mg total) by mouth 3 (three) times daily., Disp: 21 tablet, Rfl: 0   ibuprofen (ADVIL) 800 MG tablet, Take 1 tablet (800 mg total) by mouth 3 (three) times daily., Disp: 21 tablet, Rfl: 0   lidocaine (XYLOCAINE) 2 % solution, Use as directed 15 mLs in the mouth or throat as needed for mouth pain., Disp: 100 mL,  Rfl: 0   loperamide (IMODIUM) 2 MG capsule, Take 1 capsule (2 mg total) by mouth 2 (two) times daily as needed for diarrhea or loose stools., Disp: 10 capsule, Rfl: 0   methocarbamol (ROBAXIN) 500 MG tablet, Take 1 tablet (500 mg total) by mouth 2 (two) times daily., Disp: 20 tablet, Rfl: 0   ondansetron (ZOFRAN-ODT) 8 MG disintegrating tablet, Take 1 tablet (8 mg total) by mouth every 8 (eight) hours as needed for nausea or vomiting., Disp: 20 tablet, Rfl: 0  Observations/Objective: Patient is well-developed, well-nourished in no acute distress.  Resting comfortably  at home.  Head is normocephalic, atraumatic.  No labored breathing.  Speech is clear and coherent with logical content.  Patient is alert and oriented at baseline.    Assessment and Plan: 1. Sinus headache - cetirizine (ZYRTEC ALLERGY) 10 MG tablet; Take 1 tablet (10 mg total) by mouth daily.  Dispense: 90 tablet; Refill: 1 - fluticasone (FLONASE) 50 MCG/ACT nasal spray; Place 2 sprays into both nostrils daily.  Dispense: 16 g; Refill: 6 - naproxen (NAPROSYN) 500 MG tablet; Take 1 tablet (500 mg total) by mouth 2 (two) times daily with a meal.  Dispense: 30 tablet; Refill: 0  - Take meds as prescribed - Use a cool mist humidifier  -Use saline nose sprays frequently -Force fluids -For any cough or congestion  Use plain Mucinex- regular strength or max strength is fine -For fever or aces or pains- take tylenol or ibuprofen. -Throat lozenges if help -Follow up if symptoms worsen or do not improve   Follow Up Instructions: I discussed the assessment and treatment plan with the patient. The patient was provided an opportunity to ask questions and all were answered. The patient agreed with the plan and demonstrated an understanding of the instructions.  A copy of instructions were sent to the patient via MyChart unless otherwise noted below.    The patient was advised to call back or seek an in-person evaluation if the  symptoms worsen or if the condition fails to improve as anticipated.  Time:  I spent 8 minutes with the patient via telehealth technology discussing the above problems/concerns.    Jannifer Rodney, FNP

## 2022-12-01 ENCOUNTER — Telehealth: Payer: Medicaid Other | Admitting: Urgent Care

## 2022-12-01 DIAGNOSIS — J3489 Other specified disorders of nose and nasal sinuses: Secondary | ICD-10-CM | POA: Diagnosis not present

## 2022-12-01 DIAGNOSIS — R0981 Nasal congestion: Secondary | ICD-10-CM

## 2022-12-01 MED ORDER — IBUPROFEN 600 MG PO TABS: 600.0000 mg | ORAL_TABLET | Freq: Three times a day (TID) | ORAL | 0 refills | Status: DC | PRN

## 2022-12-01 MED ORDER — LEVOCETIRIZINE DIHYDROCHLORIDE 5 MG PO TABS: 5.0000 mg | ORAL_TABLET | Freq: Every evening | ORAL | 0 refills | Status: DC

## 2022-12-01 MED ORDER — NETI POT SINUS WASH 2300-700 MG NA KIT: 1.0000 "application " | PACK | Freq: Two times a day (BID) | NASAL | 0 refills | Status: DC

## 2022-12-01 NOTE — Patient Instructions (Signed)
[name], thank you for joining Maretta Bees, PA for today's virtual visit.  While this provider is not your primary care provider (PCP), if your PCP is located in our provider database this encounter information will be shared with them immediately following your visit.   A [facility] MyChart account gives you access to today's visit and all your visits, tests, and labs performed at [facility] " click here if you don't have a [facility] MyChart account or go to [email or web address]  Consent: (Patient) Heather Paul provided verbal consent for this virtual visit at the beginning of the encounter.  Current Medications:  Current Outpatient Medications:    ibuprofen (ADVIL) 600 MG tablet, Take 1 tablet (600 mg total) by mouth every 8 (eight) hours as needed for fever, headache, mild pain or moderate pain. Take with food., Disp: 30 tablet, Rfl: 0   levocetirizine (XYZAL) 5 MG tablet, Take 1 tablet (5 mg total) by mouth every evening., Disp: 30 tablet, Rfl: 0   Sodium Chloride-Sodium Bicarb (NETI POT SINUS WASH) 2300-700 MG KIT, Place 1 application  into the nose in the morning and at bedtime., Disp: 1 kit, Rfl: 0   fluticasone (FLONASE) 50 MCG/ACT nasal spray, Place 2 sprays into both nostrils daily., Disp: 16 g, Rfl: 6   Medications ordered in this encounter:  Meds ordered this encounter  Medications   ibuprofen (ADVIL) 600 MG tablet    Sig: Take 1 tablet (600 mg total) by mouth every 8 (eight) hours as needed for fever, headache, mild pain or moderate pain. Take with food.    Dispense:  30 tablet    Refill:  0    Order Specific Question:   Supervising Provider    Answer:   Loreli Dollar   Sodium Chloride-Sodium Bicarb (NETI POT SINUS WASH) 2300-700 MG KIT    Sig: Place 1 application  into the nose in the morning and at bedtime.    Dispense:  1 kit    Refill:  0    Order Specific Question:   Supervising Provider    Answer:   Merrilee Jansky  [[id number]]   levocetirizine (XYZAL) 5 MG tablet    Sig: Take 1 tablet (5 mg total) by mouth every evening.    Dispense:  30 tablet    Refill:  0    Order Specific Question:   Supervising Provider    Answer:   Merrilee Jansky [id number]     *If you need refills on other medications prior to your next appointment, please contact your pharmacy*  Follow-Up: Call back or seek an in-person evaluation if the symptoms worsen or if the condition fails to improve as anticipated.  [facility] [phone]  Other Instructions Please start your previously prescribed flonase. Do one spray in each nostril twice daily. Also, start the [facility] Sinus rinse called in today. There is an instruction sheet on how to perform the rinse attached to the patient instructions page. Use steam or humidification from a shower to help open up your sinus passage. Eucalyptus may be helpful. Take one tab of xyzal nightly. This medication may make you sleepy. You can take one tab of the ibuprofen every 6-8 hours as needed. Take with food to prevent an upset stomach. Do not take any additional OTC NSAIDs such as motrin, aleve or naproxen. Follow up with your PCP if your symptoms persist.   If you have been instructed to have an in-person evaluation today at  a local Urgent Care facility, please use the link below. It will take you to a list of all of our available [facility], including address, phone number and hours of operation. Please do not delay care.  [facility]  If you or a family member do not have a primary care provider, use the link below to schedule a visit and establish care. When you choose a [facility] primary care physician or advanced practice provider, you gain a long-term partner in health. Find a Primary Care Provider  Learn more about [facility]'s in-office and virtual care options: [facility] Now

## 2022-12-01 NOTE — Progress Notes (Signed)
Virtual Visit Consent   Heather Paul, you are scheduled for a virtual visit with a [facility] provider today. Just as with appointments in the office, your consent must be obtained to participate. Your consent will be active for this visit and any virtual visit you may have with one of our providers in the next 365 days. If you have a MyChart account, a copy of this consent can be sent to you electronically.  As this is a virtual visit, video technology does not allow for your provider to perform a traditional examination. This may limit your provider's ability to fully assess your condition. If your provider identifies any concerns that need to be evaluated in person or the need to arrange testing (such as labs, EKG, etc.), we will make arrangements to do so. Although advances in technology are sophisticated, we cannot ensure that it will always work on either your end or our end. If the connection with a video visit is poor, the visit may have to be switched to a telephone visit. With either a video or telephone visit, we are not always able to ensure that we have a secure connection.  By engaging in this virtual visit, you consent to the provision of healthcare and authorize for your insurance to be billed (if applicable) for the services provided during this visit. Depending on your insurance coverage, you may receive a charge related to this service.  I need to obtain your verbal consent now. Are you willing to proceed with your visit today? Jadine Brumley has provided verbal consent on 12/01/2022 for a virtual visit (video or telephone). Maretta Bees, PA  Date: 12/01/2022 11:31 AM  Virtual Visit via Video Note   I, Reniyah Gootee L Genaro Bekker, connected with  Italia Wolfert  ([id number], 05/21/1996) on 12/01/22 at 10:00 AM EDT by a video-enabled telemedicine application and verified that I am speaking with the correct person using two identifiers.  Location: Patient: Virtual Visit Location Patient:  Home Provider: Virtual Visit Location Provider: Home Office   I discussed the limitations of evaluation and management by telemedicine and the availability of in person appointments. The patient expressed understanding and agreed to proceed.    History of Present Illness: Heather Paul is a 27 y.o. who identifies as a female who was assigned female at birth, and is being seen today for sinus pain.  HPI: 27yo female presents today due to concern of sinus pain x 2 days. Sx started yesterday. Pt states her daughter and mom are sick with similar sx. Daughter is coughing, sneezing and having nasal congestion. Pt had similar sx about 3 weeks ago, states her sx from then resolved completely, but have come back again yesterday. Does have some rhinorrhea and post nasal drainage, but no sneezing or sore throat. No fevers. Reports nasal congestion and sinus headache, but no reproducible sx to palpation and no dental pain. Pt was recommended to take cetirizine last visit, states she is not currently doing so. Has not tried NSAIDs of her current sx. Minimal non-productive cough. No additional c/o today.   Problems:  Patient Active Problem List   Diagnosis Date Noted   Normal labor 02/05/2020   Full-term premature rupture of membranes with onset of labor within 24 hours of rupture 03/15/2017    Allergies:  Allergies  Allergen Reactions   Sulfa Antibiotics Palpitations   Medications:  Current Outpatient Medications:    ibuprofen (ADVIL) 600 MG tablet, Take 1 tablet (600 mg total) by mouth every 8 (eight)  hours as needed for fever, headache, mild pain or moderate pain. Take with food., Disp: 30 tablet, Rfl: 0   levocetirizine (XYZAL) 5 MG tablet, Take 1 tablet (5 mg total) by mouth every evening., Disp: 30 tablet, Rfl: 0   Sodium Chloride-Sodium Bicarb (NETI POT SINUS WASH) 2300-700 MG KIT, Place 1 application  into the nose in the morning and at bedtime., Disp: 1 kit, Rfl: 0   fluticasone (FLONASE) 50  MCG/ACT nasal spray, Place 2 sprays into both nostrils daily., Disp: 16 g, Rfl: 6  Observations/Objective: Patient is well-developed, well-nourished in no acute distress.  Resting comfortably at home.  Head is normocephalic, atraumatic.  No labored breathing. No coughing. Speech is clear and coherent with logical content.  Patient is alert and oriented at baseline.  Nasal quality voice, congestion. No reproducible tenderness to personal palpation of sinuses  Assessment and Plan: 1. Nasal congestion  2. Sinus pain  Sx are consistent with either allergic or viral triggers. No indications for bacterial infection at present time. Will start advil 600mg  TID prn. Recommended pt start neti-pot sinus rinses in combination with antihistamine. Pt never started the cetirizine from earlier in the month, will switch to xyzal nightly.   Follow Up Instructions: I discussed the assessment and treatment plan with the patient. The patient was provided an opportunity to ask questions and all were answered. The patient agreed with the plan and demonstrated an understanding of the instructions.  A copy of instructions were sent to the patient via MyChart unless otherwise noted below.     The patient was advised to call back or seek an in-person evaluation if the symptoms worsen or if the condition fails to improve as anticipated.  Time:  I spent 8 minutes with the patient via telehealth technology discussing the above problems/concerns.    Jayel Scaduto L Santrice Muzio, PA

## 2023-02-04 ENCOUNTER — Telehealth: Payer: Medicaid Other | Admitting: Physician Assistant

## 2023-02-04 DIAGNOSIS — N898 Other specified noninflammatory disorders of vagina: Secondary | ICD-10-CM

## 2023-02-04 MED ORDER — FLUCONAZOLE 150 MG PO TABS: 150.0000 mg | ORAL_TABLET | Freq: Once | ORAL | 0 refills | Status: AC

## 2023-02-04 NOTE — Patient Instructions (Signed)
[name], thank you for joining [name], PA-C for today's virtual visit.  While this provider is not your primary care provider (PCP), if your PCP is located in our provider database this encounter information will be shared with them immediately following your visit.   A [facility] MyChart account gives you access to today's visit and all your visits, tests, and labs performed at [facility] " click here if you don't have a [facility] MyChart account or go to [email or web address]  Consent: (Patient) Tesa Magistro provided verbal consent for this virtual visit at the beginning of the encounter.  Current Medications:  Current Outpatient Medications:    fluticasone (FLONASE) 50 MCG/ACT nasal spray, Place 2 sprays into both nostrils daily., Disp: 16 g, Rfl: 6   ibuprofen (ADVIL) 600 MG tablet, Take 1 tablet (600 mg total) by mouth every 8 (eight) hours as needed for fever, headache, mild pain or moderate pain. Take with food., Disp: 30 tablet, Rfl: 0   levocetirizine (XYZAL) 5 MG tablet, Take 1 tablet (5 mg total) by mouth every evening., Disp: 30 tablet, Rfl: 0   Sodium Chloride-Sodium Bicarb (NETI POT SINUS WASH) 2300-700 MG KIT, Place 1 application  into the nose in the morning and at bedtime., Disp: 1 kit, Rfl: 0   Medications ordered in this encounter:  No orders of the defined types were placed in this encounter.    *If you need refills on other medications prior to your next appointment, please contact your pharmacy*  Follow-Up: Call back or seek an in-person evaluation if the symptoms worsen or if the condition fails to improve as anticipated.   Virtual Care [phone]  Other Instructions Vaginal Yeast Infection, Adult  Vaginal yeast infection is a condition that causes vaginal discharge as well as soreness, swelling, and redness (inflammation) of the vagina. This is a common condition. Some women get this infection  frequently. What are the causes? This condition is caused by a change in the normal balance of the yeast (Candida) and normal bacteria that live in the vagina. This change causes an overgrowth of yeast, which causes the inflammation. What increases the risk? The condition is more likely to develop in women who: Take antibiotic medicines. Have diabetes. Take birth control pills. Are pregnant. Douche often. Have a weak body defense system (immune system). Have been taking steroid medicines for a long time. Frequently wear tight clothing. What are the signs or symptoms? Symptoms of this condition include: White, thick, creamy vaginal discharge. Swelling, itching, redness, and irritation of the vagina. The lips of the vagina (labia) may be affected as well. Pain or a burning feeling while urinating. Pain during sex. How is this diagnosed? This condition is diagnosed based on: Your medical history. A physical exam. A pelvic exam. Your health care provider will examine a sample of your vaginal discharge under a microscope. Your health care provider may send this sample for testing to confirm the diagnosis. How is this treated? This condition is treated with medicine. Medicines may be over-the-counter or prescription. You may be told to use one or more of the following: Medicine that is taken by mouth (orally). Medicine that is applied as a cream (topically). Medicine that is inserted directly into the vagina (suppository). Follow these instructions at home: Take or apply over-the-counter and prescription medicines only as told by your health care provider. Do not use tampons until your health care provider approves. Do not have sex until your infection has cleared. Sex  can prolong or worsen your symptoms of infection. Ask your health care provider when it is safe to resume sexual activity. Keep all follow-up visits. This is important. How is this prevented?  Do not wear tight clothes,  such as pantyhose or tight pants. Wear breathable cotton underwear. Do not use douches, perfumed soap, creams, or powders. Wipe from front to back after using the toilet. If you have diabetes, keep your blood sugar levels under control. Ask your health care provider for other ways to prevent yeast infections. Contact a health care provider if: You have a fever. Your symptoms go away and then return. Your symptoms do not get better with treatment. Your symptoms get worse. You have new symptoms. You develop blisters in or around your vagina. You have blood coming from your vagina and it is not your menstrual period. You develop pain in your abdomen. Summary Vaginal yeast infection is a condition that causes discharge as well as soreness, swelling, and redness (inflammation) of the vagina. This condition is treated with medicine. Medicines may be over-the-counter or prescription. Take or apply over-the-counter and prescription medicines only as told by your health care provider. Do not douche. Resume sexual activity or use of tampons as instructed by your health care provider. Contact a health care provider if your symptoms do not get better with treatment or your symptoms go away and then return. This information is not intended to replace advice given to you by your health care provider. Make sure you discuss any questions you have with your health care provider. Document Revised: 10/09/2020 Document Reviewed: 10/09/2020 Elsevier Patient Education  2024 Elsevier Inc.    If you have been instructed to have an in-person evaluation today at a local Urgent Care facility, please use the link below. It will take you to a list of all of our available [facility], including address, phone number and hours of operation. Please do not delay care.  [facility]  If you or a family member do not have a primary care provider, use the link below to schedule a visit and  establish care. When you choose a [facility] primary care physician or advanced practice provider, you gain a long-term partner in health. Find a Primary Care Provider  Learn more about 's in-office and virtual care options:  - Get Care Now

## 2023-02-04 NOTE — Progress Notes (Signed)
Virtual Visit Consent   Heather Paul, you are scheduled for a virtual visit with a [facility] provider today. Just as with appointments in the office, your consent must be obtained to participate. Your consent will be active for this visit and any virtual visit you may have with one of our providers in the next 365 days. If you have a MyChart account, a copy of this consent can be sent to you electronically.  As this is a virtual visit, video technology does not allow for your provider to perform a traditional examination. This may limit your provider's ability to fully assess your condition. If your provider identifies any concerns that need to be evaluated in person or the need to arrange testing (such as labs, EKG, etc.), we will make arrangements to do so. Although advances in technology are sophisticated, we cannot ensure that it will always work on either your end or our end. If the connection with a video visit is poor, the visit may have to be switched to a telephone visit. With either a video or telephone visit, we are not always able to ensure that we have a secure connection.  By engaging in this virtual visit, you consent to the provision of healthcare and authorize for your insurance to be billed (if applicable) for the services provided during this visit. Depending on your insurance coverage, you may receive a charge related to this service.  I need to obtain your verbal consent now. Are you willing to proceed with your visit today? [name] has provided verbal consent on 02/04/2023 for a virtual visit (video or telephone). Heather Paul, [place]  Date: 02/04/2023 8:40 AM  Virtual Visit via Video Note   I, Heather Paul, connected with  Milissa Swink  ([id number], Sep 06, 1995) on 02/04/23 at  8:30 AM EDT by a video-enabled telemedicine application and verified that I am speaking with the correct person using two identifiers.  Location: Patient: Virtual Visit Location  Patient: Home Provider: Virtual Visit Location Provider: Home Office   I discussed the limitations of evaluation and management by telemedicine and the availability of in person appointments. The patient expressed understanding and agreed to proceed.    History of Present Illness: Heather Paul is a 27 y.o. who identifies as a female who was assigned female at birth, and is being seen today for vaginal itching first noted yesterday and worsening today. Denies vaginal discharge but notes irritation of the outer labia. Denies fever, chills. Denies known change to soaps but did start using more of a type of a detergent. Denies change to urinary habits. Denies concern for STI or pregnancy.   HPI: HPI  Problems:  Patient Active Problem List   Diagnosis Date Noted   Normal labor 02/05/2020   Full-term premature rupture of membranes with onset of labor within 24 hours of rupture 03/15/2017    Allergies:  Allergies  Allergen Reactions   Sulfa Antibiotics Palpitations   Medications:  Current Outpatient Medications:    fluconazole (DIFLUCAN) 150 MG tablet, Take 1 tablet (150 mg total) by mouth once for 1 dose., Disp: 1 tablet, Rfl: 0  Observations/Objective: Patient is well-developed, well-nourished in no acute distress.  Resting comfortably  at home.  Head is normocephalic, atraumatic.  No labored breathing.  Speech is clear and coherent with logical content.  Patient is alert and oriented at baseline.   Assessment and Plan: 1. Vaginal pruritus - fluconazole (DIFLUCAN) 150 MG tablet; Take 1 tablet (150 mg total) by mouth  once for 1 dose.  Dispense: 1 tablet; Refill: 0  Concern for start of vaginal yeast infection. Supportive measures and OTC medications reviewed. Start Diflucan per orders. In person follow-up precautions reviewed.   Follow Up Instructions: I discussed the assessment and treatment plan with the patient. The patient was provided an opportunity to ask questions and all  were answered. The patient agreed with the plan and demonstrated an understanding of the instructions.  A copy of instructions were sent to the patient via MyChart unless otherwise noted below.   The patient was advised to call back or seek an in-person evaluation if the symptoms worsen or if the condition fails to improve as anticipated.  Time:  I spent 10 minutes with the patient via telehealth technology discussing the above problems/concerns.    Heather Climes, PA-C
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
## 2023-02-21 ENCOUNTER — Ambulatory Visit
Admission: RE | Admit: 2023-02-21 | Discharge: 2023-02-21 | Disposition: A | Discharge status: Home / Self Care | Payer: Medicaid Other | Source: Ambulatory Visit | Attending: Family Medicine | Admitting: Family Medicine

## 2023-02-21 VITALS — BP 109/72 | HR 79 | Temp 98.7°F | Resp 18

## 2023-02-21 DIAGNOSIS — N760 Acute vaginitis: Secondary | ICD-10-CM | POA: Diagnosis present

## 2023-02-21 MED ORDER — FLUCONAZOLE 150 MG PO TABS: 150.0000 mg | ORAL_TABLET | ORAL | 0 refills | Status: AC

## 2023-02-21 NOTE — ED Triage Notes (Signed)
Patient states she has some swelling to her vagina and has noticed some white discharge. Denies being sexually active.

## 2023-02-21 NOTE — ED Provider Notes (Signed)
[facility]    CSN: [id number] Arrival date & time: 02/21/23  1810      History   Chief Complaint Chief Complaint  Patient presents with   Vaginal Itching    HPI Heather Paul is a 27 y.o. female.    Vaginal Itching  Here for some vaginal itching that is been going on for about 2 weeks.  Now her perineum or labia feel a little swollen.  No fever or abdominal pain.  Last menstrual cycle was the 10th of this month.  No dysuria.  Past Medical History:  Diagnosis Date   Medical history non-contributory     Patient Active Problem List   Diagnosis Date Noted   Normal labor 02/05/2020   Full-term premature rupture of membranes with onset of labor within 24 hours of rupture 03/15/2017    Past Surgical History:  Procedure Laterality Date   NO PAST SURGERIES      OB History     Gravida  2   Para  2   Term  2   Preterm  0   AB  0   Living  2      SAB  0   IAB  0   Ectopic  0   Multiple  0   Live Births  2            Home Medications    Prior to Admission medications   Medication Sig Start Date End Date Taking? Authorizing Provider  fluconazole (DIFLUCAN) 150 MG tablet Take 1 tablet (150 mg total) by mouth every 3 (three) days for 2 doses. 02/21/23 02/25/23 Yes Coralee Edberg, Janace Aris, MD    Family History Family History  Problem Relation Age of Onset   Hypertension Mother    Diabetes Mother        boderline   Stroke Maternal Grandmother     Social History Social History   Tobacco Use   Smoking status: Never   Smokeless tobacco: Never  Substance Use Topics   Alcohol use: No   Drug use: No     Allergies   Sulfa antibiotics   Review of Systems Review of Systems   Physical Exam Triage Vital Signs ED Triage Vitals  Encounter Vitals Group     BP 02/21/23 1904 109/72     Systolic BP Percentile --      Diastolic BP Percentile --      Pulse Rate 02/21/23 1904 79     Resp 02/21/23 1904 18     Temp 02/21/23 1904  98.7 F (37.1 C)     Temp Source 02/21/23 1904 Oral     SpO2 02/21/23 1904 96 %     Weight --      Height --      Head Circumference --      Peak Flow --      Pain Score 02/21/23 1909 0     Pain Loc --      Pain Education --      Exclude from Growth Chart --    No data found.  Updated Vital Signs BP 109/72 (BP Location: Left Arm)   Pulse 79   Temp 98.7 F (37.1 C) (Oral)   Resp 18   LMP 02/12/2023 (Approximate)   SpO2 96%   Visual Acuity Right Eye Distance:   Left Eye Distance:   Bilateral Distance:    Right Eye Near:   Left Eye Near:    Bilateral Near:  Physical Exam Vitals reviewed.  Genitourinary:    Comments: There is no ulceration or abscess or induration noted on examination the perineum.  There is a little white discharge at the vaginal introitus.  Vaginal swab is done at the time of exam.  Chaperone was present. Skin:    Coloration: Skin is not pale.  Neurological:     Mental Status: She is alert and oriented to person, place, and time.  Psychiatric:        Behavior: Behavior normal.      UC Treatments / Results  Labs (all labs ordered are listed, but only abnormal results are displayed) Labs Reviewed  CERVICOVAGINAL ANCILLARY ONLY    EKG   Radiology No results found.  Procedures Procedures (including critical care time)  Medications Ordered in UC Medications - No data to display  Initial Impression / Assessment and Plan / UC Course  I have reviewed the triage vital signs and the nursing notes.  Pertinent labs & imaging results that were available during my care of the patient were reviewed by me and considered in my medical decision making (see chart for details).        Since she has seen some white discharge and there was some on exam, I am going to go and treat empirically for possible yeast vaginitis with fluconazole.  Vaginal swab was sent and staff will notify if there is anything else positive on that. Final Clinical  Impressions(s) / UC Diagnoses   Final diagnoses:  Acute vaginitis     Discharge Instructions      Take fluconazole 150 mg--1 tablet every 3 days for 2 doses  Staff will notify if anything is positive on the swab       ED Prescriptions     Medication Sig Dispense Auth. Provider   fluconazole (DIFLUCAN) 150 MG tablet Take 1 tablet (150 mg total) by mouth every 3 (three) days for 2 doses. 2 tablet Marlinda Mike Janace Aris, MD      PDMP not reviewed this encounter.   Zenia Resides, MD 02/21/23 Ernestina Columbia

## 2023-02-21 NOTE — Discharge Instructions (Signed)
Take fluconazole 150 mg--1 tablet every 3 days for 2 doses  Staff will notify if anything is positive on the swab

## 2023-02-24 ENCOUNTER — Emergency Department (HOSPITAL_COMMUNITY)
Admission: EM | Admit: 2023-02-24 | Discharge: 2023-02-24 | Disposition: A | Discharge status: Home / Self Care | Payer: Medicaid Other | Attending: Student | Admitting: Student

## 2023-02-24 ENCOUNTER — Other Ambulatory Visit: Payer: Self-pay

## 2023-02-24 ENCOUNTER — Encounter (HOSPITAL_COMMUNITY): Payer: Self-pay

## 2023-02-24 DIAGNOSIS — N760 Acute vaginitis: Secondary | ICD-10-CM | POA: Insufficient documentation

## 2023-02-24 DIAGNOSIS — N898 Other specified noninflammatory disorders of vagina: Secondary | ICD-10-CM | POA: Diagnosis present

## 2023-02-24 DIAGNOSIS — B9689 Other specified bacterial agents as the cause of diseases classified elsewhere: Secondary | ICD-10-CM | POA: Diagnosis not present

## 2023-02-24 DIAGNOSIS — A5909 Other urogenital trichomoniasis: Secondary | ICD-10-CM | POA: Insufficient documentation

## 2023-02-24 LAB — CERVICOVAGINAL ANCILLARY ONLY
Bacterial Vaginitis (gardnerella): POSITIVE — AB
Candida Glabrata: NEGATIVE
Candida Vaginitis: NEGATIVE
Chlamydia: NEGATIVE
Comment: NEGATIVE
Comment: NEGATIVE
Comment: NEGATIVE
Comment: NEGATIVE
Comment: NEGATIVE
Comment: NORMAL
Neisseria Gonorrhea: NEGATIVE
Trichomonas: POSITIVE — AB

## 2023-02-24 MED ORDER — METRONIDAZOLE 500 MG PO TABS
500.0000 mg | ORAL_TABLET | Freq: Once | ORAL | Status: AC
  Administered 2023-02-24: 500 mg via ORAL
  Filled 2023-02-24: qty 1

## 2023-02-24 MED ORDER — METRONIDAZOLE 500 MG PO TABS: 500.0000 mg | ORAL_TABLET | Freq: Two times a day (BID) | ORAL | 0 refills | Status: DC

## 2023-02-24 NOTE — ED Triage Notes (Signed)
Pt was seen at Urgent care a few days ago. On my chart it shows that she has a bacterial infection but no one reached out with abx. Pt would just like abx for previous dx

## 2023-02-25 ENCOUNTER — Other Ambulatory Visit (HOSPITAL_COMMUNITY): Payer: Self-pay

## 2023-02-25 NOTE — ED Provider Notes (Signed)
[place] [facility]MENT AT [facility] Provider Note  CSN: [id number] Arrival date & time: 02/24/23 1938  Chief Complaint(s) Medication Problem  HPI Heather Paul is a 27 y.o. female who presents emergency department for evaluation of vaginal discharge.  Patient was previously seen in urgent care and testing revealed that she was positive for BV and trichomonas.  She never received an antibiotic prescription so she comes to the emergency department to have this medication filled.  She endorses persistent vaginal discharge but denies nausea, vomiting, chest pain, shortness of breath or other systemic symptoms.   Past Medical History Past Medical History:  Diagnosis Date   Medical history non-contributory    Patient Active Problem List   Diagnosis Date Noted   Normal labor 02/05/2020   Full-term premature rupture of membranes with onset of labor within 24 hours of rupture 03/15/2017   Home Medication(s) Prior to Admission medications   Medication Sig Start Date End Date Taking? Authorizing Provider  metroNIDAZOLE (FLAGYL) 500 MG tablet Take 1 tablet (500 mg total) by mouth 2 (two) times daily. 02/24/23  Yes Ivelis Norgard, MD  fluconazole (DIFLUCAN) 150 MG tablet Take 1 tablet (150 mg total) by mouth every 3 (three) days for 2 doses. 02/21/23 02/25/23  Zenia Resides, MD                                                                                                                                    Past Surgical History Past Surgical History:  Procedure Laterality Date   NO PAST SURGERIES     Family History Family History  Problem Relation Age of Onset   Hypertension Mother    Diabetes Mother        boderline   Stroke Maternal Grandmother     Social History Social History   Tobacco Use   Smoking status: Never   Smokeless tobacco: Never  Substance Use Topics   Alcohol use: No   Drug use: No   Allergies Sulfa antibiotics  Review of  Systems Review of Systems  Genitourinary:  Positive for vaginal discharge.    Physical Exam Vital Signs  I have reviewed the triage vital signs BP 114/68 (BP Location: Right Arm)   Pulse 86   Temp 98.6 F (37 C) (Oral)   Resp 17   LMP 02/12/2023 (Approximate)   SpO2 100%   Physical Exam Vitals and nursing note reviewed.  Constitutional:      General: She is not in acute distress.    Appearance: She is well-developed.  HENT:     Head: Normocephalic and atraumatic.  Eyes:     Conjunctiva/sclera: Conjunctivae normal.  Cardiovascular:     Rate and Rhythm: Normal rate and regular rhythm.     Heart sounds: No murmur heard. Pulmonary:     Effort: Pulmonary effort is normal. No respiratory distress.     Breath sounds: Normal breath sounds.  Abdominal:  Palpations: Abdomen is soft.     Tenderness: There is no abdominal tenderness.  Musculoskeletal:        General: No swelling.     Cervical back: Neck supple.  Skin:    General: Skin is warm and dry.     Capillary Refill: Capillary refill takes less than 2 seconds.  Neurological:     Mental Status: She is alert.  Psychiatric:        Mood and Affect: Mood normal.     ED Results and Treatments Labs (all labs ordered are listed, but only abnormal results are displayed) Labs Reviewed - No data to display                                                                                                                        Radiology No results found.  Pertinent labs & imaging results that were available during my care of the patient were reviewed by me and considered in my medical decision making (see MDM for details).  Medications Ordered in ED Medications  metroNIDAZOLE (FLAGYL) tablet 500 mg (500 mg Oral Given 02/24/23 2036)                                                                                                                                     Procedures Procedures  (including critical care  time)  Medical Decision Making / ED Course   This patient presents to the ED for concern of vaginal discharge, this involves an extensive number of treatment options, and is a complaint that carries with it a high risk of complications and morbidity.  The differential diagnosis includes BV, trichomonas, gonorrhea, chlamydia, normal physiologic discharge  MDM: Patient seen emergency room for evaluation of vaginal discharge.  GU exam deferred per patient request which is not unreasonable.  Physical exam otherwise unremarkable.  Patient will be started on Flagyl for her BV and trichomonas and education was provided about trichomonas.  She states that she will inform her partner as they also need antibiotic therapy.  At this time she does not meet inpatient criteria for admission and she is safe for discharge with outpatient follow-up   Additional history obtained:  -External records from outside source obtained and reviewed including: Chart review including previous notes, labs, imaging, consultation notes     Medicines ordered and prescription drug management: Meds ordered this encounter  Medications   metroNIDAZOLE (  FLAGYL) tablet 500 mg   metroNIDAZOLE (FLAGYL) 500 MG tablet    Sig: Take 1 tablet (500 mg total) by mouth 2 (two) times daily.    Dispense:  14 tablet    Refill:  0    -I have reviewed the patients home medicines and have made adjustments as needed  Critical interventions none   Cardiac Monitoring: The patient was maintained on a cardiac monitor.  I personally viewed and interpreted the cardiac monitored which showed an underlying rhythm of: NSR  Social Determinants of Health:  Factors impacting patients care include: Uses condoms regularly, discussion on partner treatment   Reevaluation: After the interventions noted above, I reevaluated the patient and found that they have :improved  Co morbidities that complicate the patient evaluation  Past Medical  History:  Diagnosis Date   Medical history non-contributory       Dispostion: I considered admission for this patient, but at this time she does not meet inpatient criteria for admission and she is safe for discharge to outpatient follow-up     Final Clinical Impression(s) / ED Diagnoses Final diagnoses:  Trichomonal cervicitis  BV (bacterial vaginosis)     @PCDICTATION @    Glendora Score, MD 02/25/23 1415

## 2023-03-12 ENCOUNTER — Other Ambulatory Visit: Payer: Self-pay

## 2023-03-12 ENCOUNTER — Encounter (HOSPITAL_COMMUNITY): Payer: Self-pay

## 2023-03-12 ENCOUNTER — Emergency Department (HOSPITAL_COMMUNITY)
Admission: EM | Admit: 2023-03-12 | Discharge: 2023-03-12 | Disposition: A | Discharge status: Home / Self Care | Payer: Medicaid Other | Attending: Emergency Medicine | Admitting: Emergency Medicine

## 2023-03-12 DIAGNOSIS — U071 COVID-19: Secondary | ICD-10-CM | POA: Diagnosis not present

## 2023-03-12 DIAGNOSIS — J069 Acute upper respiratory infection, unspecified: Secondary | ICD-10-CM | POA: Insufficient documentation

## 2023-03-12 DIAGNOSIS — R0981 Nasal congestion: Secondary | ICD-10-CM | POA: Diagnosis present

## 2023-03-12 LAB — SARS CORONAVIRUS 2 BY RT PCR: SARS Coronavirus 2 by RT PCR: POSITIVE — AB

## 2023-03-12 MED ORDER — BENZONATATE 100 MG PO CAPS: 100.0000 mg | ORAL_CAPSULE | Freq: Three times a day (TID) | ORAL | 0 refills | Status: DC

## 2023-03-12 MED ORDER — GUAIFENESIN 200 MG PO TABS: 200.0000 mg | ORAL_TABLET | ORAL | 0 refills | Status: AC | PRN

## 2023-03-12 NOTE — ED Triage Notes (Signed)
Pt arrives POV with complaints of body aches, chills, and headache x a few days. Pt has not taken any medication prior to arrival. AOx4, ambulatory, resp EU

## 2023-03-12 NOTE — ED Provider Notes (Signed)
[place] [facility] AT [facility] Provider Note   CSN: [id number] Arrival date & time: 03/12/23  0865     History  Chief Complaint  Patient presents with   Nasal Congestion   Generalized Body Aches    Heather Paul is a 27 y.o. female.  The history is provided by the patient and medical records. No language interpreter was used.     27 year old female presenting with cold symptoms.  Patient reports since yesterday she has had some chills, body aches, pressure across forehead, congestion, throat irritation, occasional cough, as well as itchy eyes and runny nose.  She does not endorse any nausea vomiting or diarrhea no shortness of breath no chest pain no trouble urinating.  She is currently on her menstrual period.  She also reports recent sick contact with similar symptoms.  She denies any specific treatment tried except for taking her usual phentermine.  Notify major changes.  No history of asthma or COPD.  Home Medications Prior to Admission medications   Medication Sig Start Date End Date Taking? Authorizing Provider  metroNIDAZOLE (FLAGYL) 500 MG tablet Take 1 tablet (500 mg total) by mouth 2 (two) times daily. 02/24/23   Kommor, Wyn Forster, MD      Allergies    Sulfa antibiotics    Review of Systems   Review of Systems  All other systems reviewed and are negative.   Physical Exam Updated Vital Signs BP 117/79   Pulse 82   Temp 98.3 F (36.8 C)   Resp 16   Ht 5\' 6"  (1.676 m)   Wt 103.4 kg   LMP 02/12/2023 (Approximate)   SpO2 100%   BMI 36.80 kg/m  Physical Exam Vitals and nursing note reviewed.  Constitutional:      General: She is not in acute distress.    Appearance: She is well-developed.  HENT:     Head: Atraumatic.     Mouth/Throat:     Mouth: Mucous membranes are moist.  Eyes:     Conjunctiva/sclera: Conjunctivae normal.  Cardiovascular:     Rate and Rhythm: Normal rate and regular rhythm.     Pulses: Normal pulses.      Heart sounds: Normal heart sounds.  Pulmonary:     Effort: Pulmonary effort is normal.  Musculoskeletal:     Cervical back: Neck supple.  Skin:    Findings: No rash.  Neurological:     Mental Status: She is alert. Mental status is at baseline.  Psychiatric:        Mood and Affect: Mood normal.     ED Results / Procedures / Treatments   Labs (all labs ordered are listed, but only abnormal results are displayed) Labs Reviewed  SARS CORONAVIRUS 2 BY RT PCR    EKG None  Radiology No results found.  Procedures Procedures    Medications Ordered in ED Medications - No data to display  ED Course/ Medical Decision Making/ A&P                                 Medical Decision Making  BP 117/79   Pulse 82   Temp 98.3 F (36.8 C)   Resp 16   Ht 5\' 6"  (1.676 m)   Wt 103.4 kg   LMP 02/12/2023 (Approximate)   SpO2 100%   BMI 36.80 kg/m   41:44 AM  27 year old female presenting with cold symptoms.  Patient reports since  yesterday she has had some chills, body aches, pressure across forehead, congestion, throat irritation, occasional cough, as well as itchy eyes and runny nose.  She does not endorse any nausea vomiting or diarrhea no shortness of breath no chest pain no trouble urinating.  She is currently on her menstrual period.  She also reports recent sick contact with similar symptoms.  She denies any specific treatment tried except for taking her usual phentermine.  Notify major changes.  No history of asthma or COPD.  On exam this is a well-appearing female resting comfortably in bed appears to be in no acute discomfort.  A noticeable exam overall reassuring.  No nuchal rigidity concerning for meningitis.  Heart with normal rate and rhythm, lungs clear to auscultation bilaterally abdomen is soft and nontender.  Vital signs overall reassuring no fever no hypoxia.  11:52 AM Patient was instructed to follow-up on her COVID test through MyChart but otherwise she is stable to  be discharged home with supportive care.         Final Clinical Impression(s) / ED Diagnoses Final diagnoses:  Viral upper respiratory tract infection    Rx / DC Orders ED Discharge Orders          Ordered    guaiFENesin 200 MG tablet  Every 4 hours PRN        03/12/23 1149    benzonatate (TESSALON) 100 MG capsule  Every 8 hours        03/12/23 1149              Fayrene Helper, PA-C 03/12/23 1152    Terald Sleeper, MD 03/12/23 1444

## 2023-03-12 NOTE — ED Triage Notes (Signed)
Pt arrives via POV. Pt reports chills, cough, body aches, and congestion since yesterday.

## 2023-03-12 NOTE — Discharge Instructions (Addendum)
Please check your lab result through MyChart, link below.  If you test positive for covid, please stay  home and isolate until you are fever free and feeling better.  Return if you have any concerns.

## 2023-07-25 ENCOUNTER — Telehealth: Payer: Medicaid Other | Admitting: Physician Assistant

## 2023-07-25 DIAGNOSIS — K0510 Chronic gingivitis, plaque induced: Secondary | ICD-10-CM

## 2023-07-25 MED ORDER — TRIAMCINOLONE ACETONIDE 0.1 % MT PSTE: 1.0000 | PASTE | Freq: Two times a day (BID) | OROMUCOSAL | 12 refills | Status: DC

## 2023-07-25 MED ORDER — AMOXICILLIN-POT CLAVULANATE 875-125 MG PO TABS: 1.0000 | ORAL_TABLET | Freq: Two times a day (BID) | ORAL | 0 refills | Status: DC

## 2023-07-25 MED ORDER — CHLORHEXIDINE GLUCONATE 0.12 % MT SOLN: 15.0000 mL | Freq: Two times a day (BID) | OROMUCOSAL | 0 refills | Status: DC

## 2023-07-25 NOTE — Progress Notes (Signed)
Virtual Visit Consent   Heather Paul, you are scheduled for a virtual visit with a [facility] provider today. Just as with appointments in the office, your consent must be obtained to participate. Your consent will be active for this visit and any virtual visit you may have with one of our providers in the next 365 days. If you have a MyChart account, a copy of this consent can be sent to you electronically.  As this is a virtual visit, video technology does not allow for your provider to perform a traditional examination. This may limit your provider's ability to fully assess your condition. If your provider identifies any concerns that need to be evaluated in person or the need to arrange testing (such as labs, EKG, etc.), we will make arrangements to do so. Although advances in technology are sophisticated, we cannot ensure that it will always work on either your end or our end. If the connection with a video visit is poor, the visit may have to be switched to a telephone visit. With either a video or telephone visit, we are not always able to ensure that we have a secure connection.  By engaging in this virtual visit, you consent to the provision of healthcare and authorize for your insurance to be billed (if applicable) for the services provided during this visit. Depending on your insurance coverage, you may receive a charge related to this service.  I need to obtain your verbal consent now. Are you willing to proceed with your visit today? Heather Paul has provided verbal consent on 07/25/2023 for a virtual visit (video or telephone). [name], PA-C  Date: 07/25/2023 8:05 AM  Virtual Visit via Video Note   I, [name], connected with  Heather Paul  ([id number], 21-Apr-1996) on 07/25/23 at  8:15 AM EST by a video-enabled telemedicine application and verified that I am speaking with the correct person using two identifiers.  Location: Patient: Virtual Visit Location  Patient: Home Provider: Virtual Visit Location Provider: Home Office   I discussed the limitations of evaluation and management by telemedicine and the availability of in person appointments. The patient expressed understanding and agreed to proceed.    History of Present Illness: Heather Paul is a 27 y.o. who identifies as a female who was assigned female at birth, and is being seen today for dental pain.  HPI: Dental Pain  This is a new problem. The current episode started 1 to 4 weeks ago (2 weeks). The problem occurs constantly. The problem has been unchanged. The pain is moderate. Associated symptoms include facial pain (right), oral bleeding and thermal sensitivity (cold). Pertinent negatives include no difficulty swallowing, fever or sinus pressure. Treatments tried: ibuprofen, clove oil. The treatment provided no relief.     Problems:  Patient Active Problem List   Diagnosis Date Noted   Normal labor 02/05/2020   Full-term premature rupture of membranes with onset of labor within 24 hours of rupture 03/15/2017    Allergies:  Allergies  Allergen Reactions   Sulfa Antibiotics Palpitations   Medications:  Current Outpatient Medications:    benzonatate (TESSALON) 100 MG capsule, Take 1 capsule (100 mg total) by mouth every 8 (eight) hours., Disp: 21 capsule, Rfl: 0   guaiFENesin 200 MG tablet, Take 1 tablet (200 mg total) by mouth every 4 (four) hours as needed for cough or to loosen phlegm., Disp: 30 tablet, Rfl: 0   metroNIDAZOLE (FLAGYL) 500 MG tablet, Take 1 tablet (500 mg total) by  mouth 2 (two) times daily., Disp: 14 tablet, Rfl: 0   phentermine 15 MG capsule, Take 15 mg by mouth daily., Disp: , Rfl:   Observations/Objective: Patient is well-developed, well-nourished in no acute distress.  Resting comfortably at home.  Head is normocephalic, atraumatic.  No labored breathing.  Speech is clear and coherent with logical content.  Patient is alert and oriented at  baseline.    Assessment and Plan: There are no diagnoses linked to this encounter. - Suspected infection of gum vs tooth abscess - Amoxicillin prescribed - Added Triamcinolone paste for inflammation of fum - Peridex mouth rinse twice daily - Can use ice on outside jaw/cheek for swelling - Can also take Ibuprofen and tylenol for pain with other medications, alternate every 4 hours - Schedule a follow with a dentist as soon as possible (Can contact  dental clinic if underinsured or uninsured) - Seek in person evaluation if symptoms fail to improve or if they worsen   Follow Up Instructions: I discussed the assessment and treatment plan with the patient. The patient was provided an opportunity to ask questions and all were answered. The patient agreed with the plan and demonstrated an understanding of the instructions.  A copy of instructions were sent to the patient via MyChart unless otherwise noted below.    The patient was advised to call back or seek an in-person evaluation if the symptoms worsen or if the condition fails to improve as anticipated.    [name], PA-C

## 2023-07-25 NOTE — Patient Instructions (Signed)
Heather Paul, thank you for joining [name], PA-C for today's virtual visit.  While this provider is not your primary care provider (PCP), if your PCP is located in our provider database this encounter information will be shared with them immediately following your visit.   A [facility] MyChart account gives you access to today's visit and all your visits, tests, and labs performed at [facility] " click here if you don't have a [facility] MyChart account or go to [email or web address]  Consent: (Patient) Heather Paul provided verbal consent for this virtual visit at the beginning of the encounter.  Current Medications:  Current Outpatient Medications:    amoxicillin-clavulanate (AUGMENTIN) 875-125 MG tablet, Take 1 tablet by mouth 2 (two) times daily., Disp: 20 tablet, Rfl: 0   chlorhexidine (PERIDEX) 0.12 % solution, Use as directed 15 mLs in the mouth or throat 2 (two) times daily., Disp: 120 mL, Rfl: 0   triamcinolone (KENALOG) 0.1 % paste, Use as directed 1 Application in the mouth or throat 2 (two) times daily., Disp: 5 g, Rfl: 12   benzonatate (TESSALON) 100 MG capsule, Take 1 capsule (100 mg total) by mouth every 8 (eight) hours., Disp: 21 capsule, Rfl: 0   guaiFENesin 200 MG tablet, Take 1 tablet (200 mg total) by mouth every 4 (four) hours as needed for cough or to loosen phlegm., Disp: 30 tablet, Rfl: 0   phentermine 15 MG capsule, Take 15 mg by mouth daily., Disp: , Rfl:    Medications ordered in this encounter:  Meds ordered this encounter  Medications   amoxicillin-clavulanate (AUGMENTIN) 875-125 MG tablet    Sig: Take 1 tablet by mouth 2 (two) times daily.    Dispense:  20 tablet    Refill:  0    Supervising Provider:   Merrilee Jansky [[id number]]   chlorhexidine (PERIDEX) 0.12 % solution    Sig: Use as directed 15 mLs in the mouth or throat 2 (two) times daily.    Dispense:  120 mL    Refill:  0    Supervising Provider:   Merrilee Jansky [[id number]]   triamcinolone (KENALOG) 0.1 % paste    Sig: Use as directed 1 Application in the mouth or throat 2 (two) times daily.    Dispense:  5 g    Refill:  12    Supervising Provider:   Merrilee Jansky [[id number]]     *If you need refills on other medications prior to your next appointment, please contact your pharmacy*  Follow-Up: Call back or seek an in-person evaluation if the symptoms worsen or if the condition fails to improve as anticipated.  [facility] [phone]  Other Instructions Gingivitis Gingivitis is inflammation of the gums. It can cause redness, soreness, bleeding, and swelling of the gums. This condition is usually mild and clears up with improved home care or treatment from a dental care provider. If left untreated, gingivitis can get worse and lead to other problems with the teeth and gums. What are the causes? This condition is usually caused by a buildup of a sticky substance called plaque. Plaque is made up of mucus, bacteria, and food particles. When plaque builds up, it reacts with the saliva in the mouth to form a hard deposit called tartar or calculus, which becomes trapped around the base of the tooth. Plaque and tartar cause irritation of the gums, as well as a breeding ground for bacteria, that leads to gingivitis. Gingivitis usually results  from poor home care and not having regular dental cleaning of the mouth and teeth (oral hygiene). What increases the risk? The following factors may make you more likely to develop this condition: Not practicing proper oral hygiene. Eating a diet that does not provide proper nutrition. Taking certain medicines. Being pregnant. Going through puberty or menopause. Using products that contain nicotine and tobacco. Having certain medical conditions, such as: Diabetes. Some viral or fungal infections. Dry mouth. Wearing dental appliances that do not fit properly. What are the signs or  symptoms? Symptoms of this condition include: Gums that bleed easily, especially during flossing or brushing. Swollen gums. Gums that are bright red or purple. Receding gums. This means that the gums are wearing away from the teeth so that more of the tooth is exposed. Bad breath. How is this diagnosed? This condition is diagnosed with a medical history and an examination of the teeth and gums. You will also need X-rays to see if the inflammation has spread to the supporting structures of the teeth. How is this treated? This condition may be treated by: Having your teeth and gums cleaned at a dental care provider's office to have the plaque and tartar removed. Practicing good oral hygiene at home. This includes careful brushing and regular flossing. Using a mouthwash. In some cases, a mouthwash may be prescribed or recommended. In more advanced cases, this condition may be treated with antibiotic medicine or surgery. Follow these instructions at home:     Follow instructions from your dental care provider about how to clean your teeth. Make sure you: Brush your teeth twice a day using a soft-bristled toothbrush. Floss at least one time each day. It is best to floss before you brush your teeth. Use a mouthwash as recommended by your dental care provider Eat a well-balanced diet. Limit your intake of foods and beverages that contain sugar between meals. See a dental care provider on a regular basis for cleaning and checkups. Do not use any products that contain nicotine or tobacco, such as cigarettes, e-cigarettes, and chewing tobacco. If you need help quitting, ask your dental care provider. If you were prescribed an antibiotic medicine, take it as told by your dental care provider. Do not stop using the antibiotic even if you start to feel better. Keep all follow-up visits. This is important. Contact a health care provider if: You have a fever. You have a lot of bleeding from your  gums. You have pain in your gums or teeth. You have difficulty chewing. You have any loose or infected teeth. You have swollen glands in your face or neck. Summary Gingivitis is inflammation of the gums. It can cause redness, soreness, bleeding, and swelling of the gums. This condition is usually mild and clears up with treatment. Follow instructions from your dental care provider about how to clean your teeth. Use a mouthwash as told by your dental care provider. Contact a dental care provider if you have new or worsening symptoms. Keep all follow-up visits. This is important. This information is not intended to replace advice given to you by your health care provider. Make sure you discuss any questions you have with your health care provider. Document Revised: 12/21/2020 Document Reviewed: 12/21/2020 Elsevier Patient Education  2024 Elsevier Inc.    If you have been instructed to have an in-person evaluation today at a local Urgent Care facility, please use the link below. It will take you to a list of all of our available [facility]  Urgent Cares, including address, phone number and hours of operation. Please do not delay care.  [facility]  If you or a family member do not have a primary care provider, use the link below to schedule a visit and establish care. When you choose a [facility] primary care physician or advanced practice provider, you gain a long-term partner in health. Find a Primary Care Provider  Learn more about [facility]'s in-office and virtual care options: [facility] - Get Care Now

## 2023-07-30 ENCOUNTER — Telehealth: Payer: Medicaid Other | Admitting: Family Medicine

## 2023-07-30 DIAGNOSIS — N760 Acute vaginitis: Secondary | ICD-10-CM | POA: Diagnosis not present

## 2023-07-30 MED ORDER — FLUCONAZOLE 150 MG PO TABS: 150.0000 mg | ORAL_TABLET | Freq: Once | ORAL | 0 refills | Status: AC

## 2023-07-30 NOTE — Progress Notes (Signed)
Virtual Visit Consent   Heather Paul, you are scheduled for a virtual visit with a [facility] provider today. Just as with appointments in the office, your consent must be obtained to participate. Your consent will be active for this visit and any virtual visit you may have with one of our providers in the next 365 days. If you have a MyChart account, a copy of this consent can be sent to you electronically.  As this is a virtual visit, video technology does not allow for your provider to perform a traditional examination. This may limit your provider's ability to fully assess your condition. If your provider identifies any concerns that need to be evaluated in person or the need to arrange testing (such as labs, EKG, etc.), we will make arrangements to do so. Although advances in technology are sophisticated, we cannot ensure that it will always work on either your end or our end. If the connection with a video visit is poor, the visit may have to be switched to a telephone visit. With either a video or telephone visit, we are not always able to ensure that we have a secure connection.  By engaging in this virtual visit, you consent to the provision of healthcare and authorize for your insurance to be billed (if applicable) for the services provided during this visit. Depending on your insurance coverage, you may receive a charge related to this service.  I need to obtain your verbal consent now. Are you willing to proceed with your visit today? Heather Paul has provided verbal consent on 07/30/2023 for a virtual visit (video or telephone). Reed Pandy, [place]  Date: 07/30/2023 12:23 PM  Virtual Visit via Video Note   I, Reed Pandy, connected with  Heather Paul  ([id number], 07-15-1996) on 07/30/23 at 12:15 PM EST by a video-enabled telemedicine application and verified that I am speaking with the correct person using two identifiers.  Location: Patient: Virtual Visit Location Patient:  Home Provider: Virtual Visit Location Provider: Home Office   I discussed the limitations of evaluation and management by telemedicine and the availability of in person appointments. The patient expressed understanding and agreed to proceed.    History of Present Illness: Heather Paul is a 27 y.o. who identifies as a female who was assigned female at birth, and is being seen today for c/o having vaginal itching because of the antibiotics she is taking.  Pt states vaginal itching that started today.   HPI: HPI  Problems:  Patient Active Problem List   Diagnosis Date Noted   Normal labor 02/05/2020   Full-term premature rupture of membranes with onset of labor within 24 hours of rupture 03/15/2017    Allergies:  Allergies  Allergen Reactions   Sulfa Antibiotics Palpitations   Medications:  Current Outpatient Medications:    fluconazole (DIFLUCAN) 150 MG tablet, Take 1 tablet (150 mg total) by mouth once for 1 dose., Disp: 1 tablet, Rfl: 0   amoxicillin-clavulanate (AUGMENTIN) 875-125 MG tablet, Take 1 tablet by mouth 2 (two) times daily., Disp: 20 tablet, Rfl: 0   benzonatate (TESSALON) 100 MG capsule, Take 1 capsule (100 mg total) by mouth every 8 (eight) hours., Disp: 21 capsule, Rfl: 0   chlorhexidine (PERIDEX) 0.12 % solution, Use as directed 15 mLs in the mouth or throat 2 (two) times daily., Disp: 120 mL, Rfl: 0   guaiFENesin 200 MG tablet, Take 1 tablet (200 mg total) by mouth every 4 (four) hours as needed for cough or to loosen  phlegm., Disp: 30 tablet, Rfl: 0   phentermine 15 MG capsule, Take 15 mg by mouth daily., Disp: , Rfl:    triamcinolone (KENALOG) 0.1 % paste, Use as directed 1 Application in the mouth or throat 2 (two) times daily., Disp: 5 g, Rfl: 12  Observations/Objective: Patient is well-developed, well-nourished in no acute distress.  Resting comfortably at home.  Head is normocephalic, atraumatic.  No labored breathing.  Speech is clear and coherent with  logical content.  Patient is alert and oriented at baseline.    Assessment and Plan: 1. Vaginosis (Primary) - fluconazole (DIFLUCAN) 150 MG tablet; Take 1 tablet (150 mg total) by mouth once for 1 dose.  Dispense: 1 tablet; Refill: 0  -Start Fluconazole -Pt to F/U with PCP if symptoms persist.   Follow Up Instructions: I discussed the assessment and treatment plan with the patient. The patient was provided an opportunity to ask questions and all were answered. The patient agreed with the plan and demonstrated an understanding of the instructions.  A copy of instructions were sent to the patient via MyChart unless otherwise noted below.    The patient was advised to call back or seek an in-person evaluation if the symptoms worsen or if the condition fails to improve as anticipated.    Reed Pandy, PA-C

## 2023-07-30 NOTE — Patient Instructions (Signed)
[name], thank you for joining Reed Pandy, PA-C for today's virtual visit.  While this provider is not your primary care provider (PCP), if your PCP is located in our provider database this encounter information will be shared with them immediately following your visit.   A [facility] MyChart account gives you access to today's visit and all your visits, tests, and labs performed at [facility] " click here if you don't have a [facility] MyChart account or go to [email or web address]  Consent: (Patient) Heather Paul provided verbal consent for this virtual visit at the beginning of the encounter.  Current Medications:  Current Outpatient Medications:    fluconazole (DIFLUCAN) 150 MG tablet, Take 1 tablet (150 mg total) by mouth once for 1 dose., Disp: 1 tablet, Rfl: 0   amoxicillin-clavulanate (AUGMENTIN) 875-125 MG tablet, Take 1 tablet by mouth 2 (two) times daily., Disp: 20 tablet, Rfl: 0   benzonatate (TESSALON) 100 MG capsule, Take 1 capsule (100 mg total) by mouth every 8 (eight) hours., Disp: 21 capsule, Rfl: 0   chlorhexidine (PERIDEX) 0.12 % solution, Use as directed 15 mLs in the mouth or throat 2 (two) times daily., Disp: 120 mL, Rfl: 0   guaiFENesin 200 MG tablet, Take 1 tablet (200 mg total) by mouth every 4 (four) hours as needed for cough or to loosen phlegm., Disp: 30 tablet, Rfl: 0   phentermine 15 MG capsule, Take 15 mg by mouth daily., Disp: , Rfl:    triamcinolone (KENALOG) 0.1 % paste, Use as directed 1 Application in the mouth or throat 2 (two) times daily., Disp: 5 g, Rfl: 12   Medications ordered in this encounter:  Meds ordered this encounter  Medications   fluconazole (DIFLUCAN) 150 MG tablet    Sig: Take 1 tablet (150 mg total) by mouth once for 1 dose.    Dispense:  1 tablet    Refill:  0     *If you need refills on other medications prior to your next appointment, please contact your pharmacy*  Follow-Up: Call back or seek an  in-person evaluation if the symptoms worsen or if the condition fails to improve as anticipated.  [facility] [phone]  Other Instructions Vaginal Yeast Infection, Adult  Vaginal yeast infection is a condition that causes vaginal discharge as well as soreness, swelling, and redness (inflammation) of the vagina. This is a common condition. Some women get this infection frequently. What are the causes? This condition is caused by a change in the normal balance of the yeast (Candida) and normal bacteria that live in the vagina. This change causes an overgrowth of yeast, which causes the inflammation. What increases the risk? The condition is more likely to develop in women who: Take antibiotic medicines. Have diabetes. Take birth control pills. Are pregnant. Douche often. Have a weak body defense system (immune system). Have been taking steroid medicines for a long time. Frequently wear tight clothing. What are the signs or symptoms? Symptoms of this condition include: White, thick, creamy vaginal discharge. Swelling, itching, redness, and irritation of the vagina. The lips of the vagina (labia) may be affected as well. Pain or a burning feeling while urinating. Pain during sex. How is this diagnosed? This condition is diagnosed based on: Your medical history. A physical exam. A pelvic exam. Your health care provider will examine a sample of your vaginal discharge under a microscope. Your health care provider may send this sample for testing to confirm the diagnosis. How is  this treated? This condition is treated with medicine. Medicines may be over-the-counter or prescription. You may be told to use one or more of the following: Medicine that is taken by mouth (orally). Medicine that is applied as a cream (topically). Medicine that is inserted directly into the vagina (suppository). Follow these instructions at home: Take or apply over-the-counter and  prescription medicines only as told by your health care provider. Do not use tampons until your health care provider approves. Do not have sex until your infection has cleared. Sex can prolong or worsen your symptoms of infection. Ask your health care provider when it is safe to resume sexual activity. Keep all follow-up visits. This is important. How is this prevented?  Do not wear tight clothes, such as pantyhose or tight pants. Wear breathable cotton underwear. Do not use douches, perfumed soap, creams, or powders. Wipe from front to back after using the toilet. If you have diabetes, keep your blood sugar levels under control. Ask your health care provider for other ways to prevent yeast infections. Contact a health care provider if: You have a fever. Your symptoms go away and then return. Your symptoms do not get better with treatment. Your symptoms get worse. You have new symptoms. You develop blisters in or around your vagina. You have blood coming from your vagina and it is not your menstrual period. You develop pain in your abdomen. Summary Vaginal yeast infection is a condition that causes discharge as well as soreness, swelling, and redness (inflammation) of the vagina. This condition is treated with medicine. Medicines may be over-the-counter or prescription. Take or apply over-the-counter and prescription medicines only as told by your health care provider. Do not douche. Resume sexual activity or use of tampons as instructed by your health care provider. Contact a health care provider if your symptoms do not get better with treatment or your symptoms go away and then return. This information is not intended to replace advice given to you by your health care provider. Make sure you discuss any questions you have with your health care provider. Document Revised: 10/09/2020 Document Reviewed: 10/09/2020 Elsevier Patient Education  2024 Elsevier Inc.    If you have been  instructed to have an in-person evaluation today at a local Urgent Care facility, please use the link below. It will take you to a list of all of our available [facility], including address, phone number and hours of operation. Please do not delay care.  [facility]  If you or a family member do not have a primary care provider, use the link below to schedule a visit and establish care. When you choose a [facility] primary care physician or advanced practice provider, you gain a long-term partner in health. Find a Primary Care Provider  Learn more about [facility]'s in-office and virtual care options: [facility] - Get Care Now

## 2023-08-01 ENCOUNTER — Encounter: Payer: Medicaid Other | Admitting: Physician Assistant

## 2023-08-01 ENCOUNTER — Telehealth: Payer: Medicaid Other | Admitting: Physician Assistant

## 2023-08-01 ENCOUNTER — Encounter: Payer: Self-pay | Admitting: Physician Assistant

## 2023-08-01 DIAGNOSIS — B379 Candidiasis, unspecified: Secondary | ICD-10-CM

## 2023-08-01 DIAGNOSIS — T3695XA Adverse effect of unspecified systemic antibiotic, initial encounter: Secondary | ICD-10-CM

## 2023-08-01 MED ORDER — TERCONAZOLE 0.4 % VA CREA: 1.0000 | TOPICAL_CREAM | Freq: Every day | VAGINAL | 0 refills | Status: DC

## 2023-08-01 MED ORDER — TERCONAZOLE 0.8 % VA CREA: 1.0000 | TOPICAL_CREAM | Freq: Every day | VAGINAL | 0 refills | Status: DC

## 2023-08-01 MED ORDER — FLUCONAZOLE 150 MG PO TABS: 150.0000 mg | ORAL_TABLET | Freq: Once | ORAL | 0 refills | Status: AC

## 2023-08-01 NOTE — Telephone Encounter (Signed)
This encounter was created in error - please disregard.

## 2023-08-01 NOTE — Progress Notes (Signed)

## 2023-08-01 NOTE — Progress Notes (Signed)
Duplicate, marked erroneous 

## 2023-08-01 NOTE — Addendum Note (Signed)
Addended by: [name] on: 08/01/2023 03:29 PM   Modules accepted: Orders

## 2023-08-02 ENCOUNTER — Telehealth: Payer: Medicaid Other | Admitting: Family Medicine

## 2023-08-02 DIAGNOSIS — N760 Acute vaginitis: Secondary | ICD-10-CM | POA: Diagnosis not present

## 2023-08-02 NOTE — Patient Instructions (Signed)
  [name], thank you for joining Reed Pandy, PA-C for today's virtual visit.  While this provider is not your primary care provider (PCP), if your PCP is located in our provider database this encounter information will be shared with them immediately following your visit.   A [place] [facility] MyChart account gives you access to today's visit and all your visits, tests, and labs performed at [facility] " click here if you don't have a [facility] MyChart account or go to [email or web address]  Consent: (Patient) Heather Paul provided verbal consent for this virtual visit at the beginning of the encounter.  Current Medications:  Current Outpatient Medications:    amoxicillin-clavulanate (AUGMENTIN) 875-125 MG tablet, Take 1 tablet by mouth 2 (two) times daily., Disp: 20 tablet, Rfl: 0   benzonatate (TESSALON) 100 MG capsule, Take 1 capsule (100 mg total) by mouth every 8 (eight) hours., Disp: 21 capsule, Rfl: 0   chlorhexidine (PERIDEX) 0.12 % solution, Use as directed 15 mLs in the mouth or throat 2 (two) times daily., Disp: 120 mL, Rfl: 0   guaiFENesin 200 MG tablet, Take 1 tablet (200 mg total) by mouth every 4 (four) hours as needed for cough or to loosen phlegm., Disp: 30 tablet, Rfl: 0   phentermine 15 MG capsule, Take 15 mg by mouth daily., Disp: , Rfl:    terconazole (TERAZOL 3) 0.8 % vaginal cream, Place 1 applicator vaginally at bedtime., Disp: 20 g, Rfl: 0   triamcinolone (KENALOG) 0.1 % paste, Use as directed 1 Application in the mouth or throat 2 (two) times daily., Disp: 5 g, Rfl: 12   Medications ordered in this encounter:  No orders of the defined types were placed in this encounter.    *If you need refills on other medications prior to your next appointment, please contact your pharmacy*  Follow-Up: Call back or seek an in-person evaluation if the symptoms worsen or if the condition fails to improve as anticipated.  [facility] [phone]  Other Instructions    If you have been instructed to have an in-person evaluation today at a local Urgent Care facility, please use the link below. It will take you to a list of all of our available [place] [facility], including address, phone number and hours of operation. Please do not delay care.  [facility]  If you or a family member do not have a primary care provider, use the link below to schedule a visit and establish care. When you choose a [facility] primary care physician or advanced practice provider, you gain a long-term partner in health. Find a Primary Care Provider  Learn more about [facility]'s in-office and virtual care options: [facility] - Get Care Now

## 2023-08-02 NOTE — Progress Notes (Signed)
Virtual Visit Consent   Heather Paul, you are scheduled for a virtual visit with a [facility] provider today. Just as with appointments in the office, your consent must be obtained to participate. Your consent will be active for this visit and any virtual visit you may have with one of our providers in the next 365 days. If you have a MyChart account, a copy of this consent can be sent to you electronically.  As this is a virtual visit, video technology does not allow for your provider to perform a traditional examination. This may limit your provider's ability to fully assess your condition. If your provider identifies any concerns that need to be evaluated in person or the need to arrange testing (such as labs, EKG, etc.), we will make arrangements to do so. Although advances in technology are sophisticated, we cannot ensure that it will always work on either your end or our end. If the connection with a video visit is poor, the visit may have to be switched to a telephone visit. With either a video or telephone visit, we are not always able to ensure that we have a secure connection.  By engaging in this virtual visit, you consent to the provision of healthcare and authorize for your insurance to be billed (if applicable) for the services provided during this visit. Depending on your insurance coverage, you may receive a charge related to this service.  I need to obtain your verbal consent now. Are you willing to proceed with your visit today? Heather Paul has provided verbal consent on 08/02/2023 for a virtual visit (video or telephone). Reed Pandy, [place]  Date: 08/02/2023 11:25 AM  Virtual Visit via Video Note   I, Reed Pandy, connected with  Heather Paul  ([id number], [birth year]) on 08/02/23 at 11:15 AM EST by a video-enabled telemedicine application and verified that I am speaking with the correct person using two identifiers.  Location: Patient: Virtual Visit Location Patient:  Home Provider: Virtual Visit Location Provider: Home Office   I discussed the limitations of evaluation and management by telemedicine and the availability of in person appointments. The patient expressed understanding and agreed to proceed.    History of Present Illness: Heather Paul is a 27 y.o. who identifies as a female who was assigned female at birth, and is being seen today for Pt states she was prescribed a cream for her vaginal itching but the cream was not covered by insurance.  Pt wanted to know if she could use the over the counter Monistat cream and oval despite taking Fluconazole yesterday.  HPI: HPI  Problems:  Patient Active Problem List   Diagnosis Date Noted   Normal labor 02/05/2020   Full-term premature rupture of membranes with onset of labor within 24 hours of rupture 03/15/2017    Allergies:  Allergies  Allergen Reactions   Sulfa Antibiotics Palpitations   Medications:  Current Outpatient Medications:    amoxicillin-clavulanate (AUGMENTIN) 875-125 MG tablet, Take 1 tablet by mouth 2 (two) times daily., Disp: 20 tablet, Rfl: 0   benzonatate (TESSALON) 100 MG capsule, Take 1 capsule (100 mg total) by mouth every 8 (eight) hours., Disp: 21 capsule, Rfl: 0   chlorhexidine (PERIDEX) 0.12 % solution, Use as directed 15 mLs in the mouth or throat 2 (two) times daily., Disp: 120 mL, Rfl: 0   guaiFENesin 200 MG tablet, Take 1 tablet (200 mg total) by mouth every 4 (four) hours as needed for cough or to loosen phlegm., Disp: 30 tablet,  Rfl: 0   phentermine 15 MG capsule, Take 15 mg by mouth daily., Disp: , Rfl:    terconazole (TERAZOL 3) 0.8 % vaginal cream, Place 1 applicator vaginally at bedtime., Disp: 20 g, Rfl: 0   triamcinolone (KENALOG) 0.1 % paste, Use as directed 1 Application in the mouth or throat 2 (two) times daily., Disp: 5 g, Rfl: 12  Observations/Objective: Patient is well-developed, well-nourished in no acute distress.  Resting comfortably at home.   Head is normocephalic, atraumatic.  No labored breathing.  Speech is clear and coherent with logical content.  Patient is alert and oriented at baseline.    Assessment and Plan: 1. Vaginosis (Primary)  -Advised Pt ok to use Monistat cream and to notify us via mychart of any changes  -Pt states she has scheduled appointment with PCP  Follow Up Instructions: I discussed the assessment and treatment plan with the patient. The patient was provided an opportunity to ask questions and all were answered. The patient agreed with the plan and demonstrated an understanding of the instructions.  A copy of instructions were sent to the patient via MyChart unless otherwise noted below.    The patient was advised to call Paul or seek an in-person evaluation if the symptoms worsen or if the condition fails to improve as anticipated.    Reed Pandy, PA-C

## 2023-08-26 ENCOUNTER — Telehealth: Payer: Medicaid Other | Admitting: Physician Assistant

## 2023-08-26 DIAGNOSIS — B9689 Other specified bacterial agents as the cause of diseases classified elsewhere: Secondary | ICD-10-CM

## 2023-08-26 DIAGNOSIS — J019 Acute sinusitis, unspecified: Secondary | ICD-10-CM | POA: Diagnosis not present

## 2023-08-26 MED ORDER — FLUTICASONE PROPIONATE 50 MCG/ACT NA SUSP: 2.0000 | Freq: Every day | NASAL | 0 refills | Status: DC

## 2023-08-26 MED ORDER — DOXYCYCLINE HYCLATE 100 MG PO TABS: 100.0000 mg | ORAL_TABLET | Freq: Two times a day (BID) | ORAL | 0 refills | Status: DC

## 2023-08-26 NOTE — Progress Notes (Signed)
Virtual Visit Consent   Heather Paul, you are scheduled for a virtual visit with a [facility] provider today. Just as with appointments in the office, your consent must be obtained to participate. Your consent will be active for this visit and any virtual visit you may have with one of our providers in the next 365 days. If you have a MyChart account, a copy of this consent can be sent to you electronically.  As this is a virtual visit, video technology does not allow for your provider to perform a traditional examination. This may limit your provider's ability to fully assess your condition. If your provider identifies any concerns that need to be evaluated in person or the need to arrange testing (such as labs, EKG, etc.), we will make arrangements to do so. Although advances in technology are sophisticated, we cannot ensure that it will always work on either your end or our end. If the connection with a video visit is poor, the visit may have to be switched to a telephone visit. With either a video or telephone visit, we are not always able to ensure that we have a secure connection.  By engaging in this virtual visit, you consent to the provision of healthcare and authorize for your insurance to be billed (if applicable) for the services provided during this visit. Depending on your insurance coverage, you may receive a charge related to this service.  I need to obtain your verbal consent now. Are you willing to proceed with your visit today? Heather Paul has provided verbal consent on 09/03/2023 for a virtual visit (video or telephone). [name], [place]  Date: 08/26/2023 8:17 AM  Virtual Visit via Video Note   I, [name], connected with  Heather Paul  ([id number], November 16, 1995) on 08/26/23 at  8:15 AM EST by a video-enabled telemedicine application and verified that I am speaking with the correct person using two identifiers.  Location: Patient: Virtual Visit Location  Patient: Home Provider: Virtual Visit Location Provider: Home Office   I discussed the limitations of evaluation and management by telemedicine and the availability of in person appointments. The patient expressed understanding and agreed to proceed.    History of Present Illness: Heather Paul is a 28 y.o. who identifies as a female who was assigned female at birth, and is being seen today for sinus pressure and sinus pain with facial pain over the past several days. Denies fever, chills, aches. Denies recent travel.   OTC -- Dayquil, Ibuprofen  HPI: HPI  Problems:  Patient Active Problem List   Diagnosis Date Noted   Normal labor 02/05/2020   Full-term premature rupture of membranes with onset of labor within 24 hours of rupture 03/15/2017    Allergies:  Allergies  Allergen Reactions   Sulfa Antibiotics Palpitations   Medications:  Current Outpatient Medications:    doxycycline (VIBRA-TABS) 100 MG tablet, Take 1 tablet (100 mg total) by mouth 2 (two) times daily., Disp: 20 tablet, Rfl: 0   fluticasone (FLONASE) 50 MCG/ACT nasal spray, Place 2 sprays into both nostrils daily., Disp: 16 g, Rfl: 0   guaiFENesin 200 MG tablet, Take 1 tablet (200 mg total) by mouth every 4 (four) hours as needed for cough or to loosen phlegm., Disp: 30 tablet, Rfl: 0   phentermine 15 MG capsule, Take 15 mg by mouth daily., Disp: , Rfl:   Observations/Objective: Patient is well-developed, well-nourished in no acute distress.  Resting comfortably at home.  Head is normocephalic, atraumatic.  No labored breathing. Speech is clear and coherent with logical content.  Patient is alert and oriented at baseline.   Assessment and Plan: 1. Acute bacterial sinusitis (Primary) - doxycycline (VIBRA-TABS) 100 MG tablet; Take 1 tablet (100 mg total) by mouth 2 (two) times daily.  Dispense: 20 tablet; Refill: 0 - fluticasone (FLONASE) 50 MCG/ACT nasal spray; Place 2 sprays into both nostrils daily.  Dispense:  16 g; Refill: 0  Rx Doxycycline.  Increase fluids.  Rest.  Saline nasal spray.  Probiotic.  Mucinex as directed.  Humidifier in bedroom. Flonase per orders.  Call or return to clinic if symptoms are not improving.   Follow Up Instructions: I discussed the assessment and treatment plan with the patient. The patient was provided an opportunity to ask questions and all were answered. The patient agreed with the plan and demonstrated an understanding of the instructions.  A copy of instructions were sent to the patient via MyChart unless otherwise noted below.   The patient was advised to call back or seek an in-person evaluation if the symptoms worsen or if the condition fails to improve as anticipated.    [name], PA-C

## 2023-08-26 NOTE — Patient Instructions (Signed)
Heather Paul, thank you for joining [name], PA-C for today's virtual visit.  While this provider is not your primary care provider (PCP), if your PCP is located in our provider database this encounter information will be shared with them immediately following your visit.   A [facility] MyChart account gives you access to today's visit and all your visits, tests, and labs performed at [facility] " click here if you don't have a [facility] MyChart account or go to [email or web address]  Consent: (Patient) Heather Paul provided verbal consent for this virtual visit at the beginning of the encounter.  Current Medications:  Current Outpatient Medications:    amoxicillin-clavulanate (AUGMENTIN) 875-125 MG tablet, Take 1 tablet by mouth 2 (two) times daily., Disp: 20 tablet, Rfl: 0   benzonatate (TESSALON) 100 MG capsule, Take 1 capsule (100 mg total) by mouth every 8 (eight) hours., Disp: 21 capsule, Rfl: 0   chlorhexidine (PERIDEX) 0.12 % solution, Use as directed 15 mLs in the mouth or throat 2 (two) times daily., Disp: 120 mL, Rfl: 0   guaiFENesin 200 MG tablet, Take 1 tablet (200 mg total) by mouth every 4 (four) hours as needed for cough or to loosen phlegm., Disp: 30 tablet, Rfl: 0   phentermine 15 MG capsule, Take 15 mg by mouth daily., Disp: , Rfl:    terconazole (TERAZOL 3) 0.8 % vaginal cream, Place 1 applicator vaginally at bedtime., Disp: 20 g, Rfl: 0   triamcinolone (KENALOG) 0.1 % paste, Use as directed 1 Application in the mouth or throat 2 (two) times daily., Disp: 5 g, Rfl: 12   Medications ordered in this encounter:  No orders of the defined types were placed in this encounter.    *If you need refills on other medications prior to your next appointment, please contact your pharmacy*  Follow-Up: Call back or seek an in-person evaluation if the symptoms worsen or if the condition fails to improve as anticipated.  [facility]  [phone]  Other Instructions Please take antibiotic as directed.  Increase fluid intake.  Use Saline nasal spray.  Take a daily multivitamin. Use the Flonase as directed. Ok to continue your OTC medications.  Place a humidifier in the bedroom.  Please call or return clinic if symptoms are not improving.  Sinusitis Sinusitis is redness, soreness, and swelling (inflammation) of the paranasal sinuses. Paranasal sinuses are air pockets within the bones of your face (beneath the eyes, the middle of the forehead, or above the eyes). In healthy paranasal sinuses, mucus is able to drain out, and air is able to circulate through them by way of your nose. However, when your paranasal sinuses are inflamed, mucus and air can become trapped. This can allow bacteria and other germs to grow and cause infection. Sinusitis can develop quickly and last only a short time (acute) or continue over a long period (chronic). Sinusitis that lasts for more than 12 weeks is considered chronic.  CAUSES  Causes of sinusitis include: Allergies. Structural abnormalities, such as displacement of the cartilage that separates your nostrils (deviated septum), which can decrease the air flow through your nose and sinuses and affect sinus drainage. Functional abnormalities, such as when the small hairs (cilia) that line your sinuses and help remove mucus do not work properly or are not present. SYMPTOMS  Symptoms of acute and chronic sinusitis are the same. The primary symptoms are pain and pressure around the affected sinuses. Other symptoms include: Upper toothache. Earache. Headache. Bad  breath. Decreased sense of smell and taste. A cough, which worsens when you are lying flat. Fatigue. Fever. Thick drainage from your nose, which often is green and may contain pus (purulent). Swelling and warmth over the affected sinuses. DIAGNOSIS  Your caregiver will perform a physical exam. During the exam, your caregiver  may: Look in your nose for signs of abnormal growths in your nostrils (nasal polyps). Tap over the affected sinus to check for signs of infection. View the inside of your sinuses (endoscopy) with a special imaging device with a light attached (endoscope), which is inserted into your sinuses. If your caregiver suspects that you have chronic sinusitis, one or more of the following tests may be recommended: Allergy tests. Nasal culture A sample of mucus is taken from your nose and sent to a lab and screened for bacteria. Nasal cytology A sample of mucus is taken from your nose and examined by your caregiver to determine if your sinusitis is related to an allergy. TREATMENT  Most cases of acute sinusitis are related to a viral infection and will resolve on their own within 10 days. Sometimes medicines are prescribed to help relieve symptoms (pain medicine, decongestants, nasal steroid sprays, or saline sprays).  However, for sinusitis related to a bacterial infection, your caregiver will prescribe antibiotic medicines. These are medicines that will help kill the bacteria causing the infection.  Rarely, sinusitis is caused by a fungal infection. In theses cases, your caregiver will prescribe antifungal medicine. For some cases of chronic sinusitis, surgery is needed. Generally, these are cases in which sinusitis recurs more than 3 times per year, despite other treatments. HOME CARE INSTRUCTIONS  Drink plenty of water. Water helps thin the mucus so your sinuses can drain more easily. Use a humidifier. Inhale steam 3 to 4 times a day (for example, sit in the bathroom with the shower running). Apply a warm, moist washcloth to your face 3 to 4 times a day, or as directed by your caregiver. Use saline nasal sprays to help moisten and clean your sinuses. Take over-the-counter or prescription medicines for pain, discomfort, or fever only as directed by your caregiver. SEEK IMMEDIATE MEDICAL CARE IF: You  have increasing pain or severe headaches. You have nausea, vomiting, or drowsiness. You have swelling around your face. You have vision problems. You have a stiff neck. You have difficulty breathing. MAKE SURE YOU:  Understand these instructions. Will watch your condition. Will get help right away if you are not doing well or get worse. Document Released: 07/22/2005 Document Revised: 10/14/2011 Document Reviewed: 08/06/2011 [facility] Patient Information 2014 [place], [place].     If you have been instructed to have an in-person evaluation today at a local Urgent Care facility, please use the link below. It will take you to a list of all of our available [facility], including address, phone number and hours of operation. Please do not delay care.  [facility]  If you or a family member do not have a primary care provider, use the link below to schedule a visit and establish care. When you choose a [facility] primary care physician or advanced practice provider, you gain a long-term partner in health. Find a Primary Care Provider  Learn more about [facility]'s in-office and virtual care options: [facility] - Get Care Now

## 2023-09-10 ENCOUNTER — Telehealth: Payer: Medicaid Other | Admitting: Physician Assistant

## 2023-09-10 DIAGNOSIS — K047 Periapical abscess without sinus: Secondary | ICD-10-CM | POA: Diagnosis not present

## 2023-09-10 MED ORDER — NAPROXEN 500 MG PO TABS: 500.0000 mg | ORAL_TABLET | Freq: Two times a day (BID) | ORAL | 0 refills | Status: AC

## 2023-09-10 MED ORDER — AMOXICILLIN-POT CLAVULANATE 875-125 MG PO TABS: 1.0000 | ORAL_TABLET | Freq: Two times a day (BID) | ORAL | 0 refills | Status: DC

## 2023-09-10 MED ORDER — FLUCONAZOLE 150 MG PO TABS: ORAL_TABLET | ORAL | 0 refills | Status: DC

## 2023-09-10 MED ORDER — NAPROXEN 500 MG PO TABS: 500.0000 mg | ORAL_TABLET | Freq: Two times a day (BID) | ORAL | 0 refills | Status: DC

## 2023-09-10 NOTE — Progress Notes (Signed)
 I have spent 5 minutes in review of e-visit questionnaire, review and updating patient chart, medical decision making and response to patient.   [name], PA-C

## 2023-09-10 NOTE — Addendum Note (Signed)
 Addended by: Farris Hong on: 09/10/2023 08:55 AM   Modules accepted: Orders

## 2023-09-10 NOTE — Addendum Note (Signed)
 Addended by: Farris Hong on: 09/10/2023 08:18 AM   Modules accepted: Orders

## 2023-09-10 NOTE — Progress Notes (Signed)

## 2024-01-21 ENCOUNTER — Telehealth: Admitting: Physician Assistant

## 2024-01-21 DIAGNOSIS — J00 Acute nasopharyngitis [common cold]: Secondary | ICD-10-CM

## 2024-01-21 MED ORDER — GUAIFENESIN ER 600 MG PO TB12
600.0000 mg | ORAL_TABLET | Freq: Two times a day (BID) | ORAL | 0 refills | Status: DC
Start: 2024-01-21 — End: 2024-01-21

## 2024-01-21 MED ORDER — FLUTICASONE PROPIONATE 50 MCG/ACT NA SUSP: 2.0000 | Freq: Every day | NASAL | 0 refills | Status: AC

## 2024-01-21 NOTE — Progress Notes (Signed)
 Ms. Heather Paul, Heather Paul are scheduled for a virtual visit with your provider today.    Just as we do with appointments in the office, we must obtain your consent to participate.  Your consent will be active for this visit and any virtual visit you may have with one of our providers in the next 365 days.    If you have a MyChart account, I can also send a copy of this consent to you electronically.  All virtual visits are billed to your insurance company just like a traditional visit in the office.  As this is a virtual visit, video technology does not allow for your provider to perform a traditional examination.  This may limit your provider's ability to fully assess your condition.  If your provider identifies any concerns that need to be evaluated in person or the need to arrange testing such as labs, EKG, etc, we will make arrangements to do so.    Although advances in technology are sophisticated, we cannot ensure that it will always work on either your end or our end.  If the connection with a video visit is poor, we may have to switch to a telephone visit.  With either a video or telephone visit, we are not always able to ensure that we have a secure connection.   I need to obtain your verbal consent now.   Are you willing to proceed with your visit today?   Heather Paul has provided verbal consent on 01/21/2024 for a virtual visit (video or telephone).   Heather Kales, PA-C 01/21/2024  10:37 AM   Date:  01/21/2024   ID:  Heather Paul, DOB Nov 25, 1995, MRN [id number]  Patient Location: Home Provider Location: Home Office   Participants: Patient and Provider for Visit and Wrap up  Method of visit: Video  Location of Patient: Home Location of Provider: Home Office Consent was obtain for visit over the video. Services rendered by provider: Visit was performed via video  A video enabled telemedicine application was used and I verified that I am speaking with the correct person using two  identifiers.  PCP:  Pcp, No   Chief Complaint:  sinus problem  History of Present Illness:    Heather Paul is a 28 y.o. female with history as stated below. Presents video telehealth for an acute care visit  Pt reports rhinorrhea, congestion and sinus pressure for the last 2 days. Denies fevers, sore throat, cough. Denies sick contacts. Does have a history of allergies. Started taking allegra yesterday  Past Medical, Surgical, Social History, Allergies, and Medications have been Reviewed.  Past Medical History:  Diagnosis Date   Medical history non-contributory     Current Meds  Medication Sig   fluticasone  (FLONASE ) 50 MCG/ACT nasal spray Place 2 sprays into both nostrils daily.   [DISCONTINUED] guaiFENesin  (MUCINEX ) 600 MG 12 hr tablet Take 1 tablet (600 mg total) by mouth 2 (two) times daily.     Allergies:   Sulfa antibiotics   ROS See HPI for history of present illness.  Physical Exam Constitutional:      Comments: Clear speech   Neurological:     Mental Status: She is alert.               MDM: pt with sinus sxs x2 days, h/o allergies. Likely viral sinusitis vs allergic rhinitis. Advised continuation of allegra and will give fluticasone   Tests Ordered: No orders of the defined types were placed in this encounter.   Medication Changes:  Meds ordered this encounter  Medications   fluticasone  (FLONASE ) 50 MCG/ACT nasal spray    Sig: Place 2 sprays into both nostrils daily.    Dispense:  16 g    Refill:  0   DISCONTD: guaiFENesin  (MUCINEX ) 600 MG 12 hr tablet    Sig: Take 1 tablet (600 mg total) by mouth 2 (two) times daily.    Dispense:  6 tablet    Refill:  0     Disposition:  Follow up  Signed, Heather Kales, PA-C  01/21/2024 10:37 AM

## 2024-01-21 NOTE — Patient Instructions (Addendum)
  Kalvin Orf, thank you for joining Aminta Kales, PA-C for today's virtual visit.  While this provider is not your primary care provider (PCP), if your PCP is located in our provider database this encounter information will be shared with them immediately following your visit.   A [facility] MyChart account gives you access to today's visit and all your visits, tests, and labs performed at [facility]  click here if you don't have a [facility] MyChart account or go to [email or web address]  Consent: (Patient) Candance Bohlman provided verbal consent for this virtual visit at the beginning of the encounter.  Current Medications:  Current Outpatient Medications:    amoxicillin -clavulanate (AUGMENTIN ) 875-125 MG tablet, Take 1 tablet by mouth 2 (two) times daily., Disp: 14 tablet, Rfl: 0   fluconazole  (DIFLUCAN ) 150 MG tablet, Take 1 tablet PO once. Repeat in 3 days if needed., Disp: 2 tablet, Rfl: 0   fluticasone  (FLONASE ) 50 MCG/ACT nasal spray, Place 2 sprays into both nostrils daily., Disp: 16 g, Rfl: 0   guaiFENesin  200 MG tablet, Take 1 tablet (200 mg total) by mouth every 4 (four) hours as needed for cough or to loosen phlegm., Disp: 30 tablet, Rfl: 0   naproxen  (NAPROSYN ) 500 MG tablet, Take 1 tablet (500 mg total) by mouth 2 (two) times daily with a meal., Disp: 20 tablet, Rfl: 0   phentermine 15 MG capsule, Take 15 mg by mouth daily., Disp: , Rfl:    Medications ordered in this encounter:  No orders of the defined types were placed in this encounter.    *If you need refills on other medications prior to your next appointment, please contact your pharmacy*  Follow-Up: Call back or seek an in-person evaluation if the symptoms worsen or if the condition fails to improve as anticipated.  [facility] [phone]  Other Instructions Use the fluticasone  as directed   Continue taking the allegra  Follow up with your regular doctor in 1 week for  reassessment and seek care sooner if your symptoms worsen or fail to improve.   If you have been instructed to have an in-person evaluation today at a local Urgent Care facility, please use the link below. It will take you to a list of all of our available [facility], including address, phone number and hours of operation. Please do not delay care.  [facility]  If you or a family member do not have a primary care provider, use the link below to schedule a visit and establish care. When you choose a [facility] primary care physician or advanced practice provider, you gain a long-term partner in health. Find a Primary Care Provider  Learn more about [facility]'s in-office and virtual care options: [facility] - Get Care Now

## 2024-02-24 ENCOUNTER — Telehealth: Admitting: Nurse Practitioner

## 2024-02-24 DIAGNOSIS — B379 Candidiasis, unspecified: Secondary | ICD-10-CM | POA: Diagnosis not present

## 2024-02-24 DIAGNOSIS — R3989 Other symptoms and signs involving the genitourinary system: Secondary | ICD-10-CM | POA: Diagnosis not present

## 2024-02-24 DIAGNOSIS — T3695XA Adverse effect of unspecified systemic antibiotic, initial encounter: Secondary | ICD-10-CM

## 2024-02-24 MED ORDER — NITROFURANTOIN MONOHYD MACRO 100 MG PO CAPS
100.0000 mg | ORAL_CAPSULE | Freq: Two times a day (BID) | ORAL | 0 refills | Status: AC
Start: 2024-02-24 — End: 2024-02-29

## 2024-02-24 MED ORDER — FLUCONAZOLE 150 MG PO TABS: 150.0000 mg | ORAL_TABLET | Freq: Once | ORAL | 0 refills | Status: AC

## 2024-02-24 NOTE — Progress Notes (Signed)
 Virtual Visit Consent   Heather Paul, you are scheduled for a virtual visit with a [facility] provider today. Just as with appointments in the office, your consent must be obtained to participate. Your consent will be active for this visit and any virtual visit you may have with one of our providers in the next 365 days. If you have a MyChart account, a copy of this consent can be sent to you electronically.  As this is a virtual visit, video technology does not allow for your provider to perform a traditional examination. This may limit your provider's ability to fully assess your condition. If your provider identifies any concerns that need to be evaluated in person or the need to arrange testing (such as labs, EKG, etc.), we will make arrangements to do so. Although advances in technology are sophisticated, we cannot ensure that it will always work on either your end or our end. If the connection with a video visit is poor, the visit may have to be switched to a telephone visit. With either a video or telephone visit, we are not always able to ensure that we have a secure connection.  By engaging in this virtual visit, you consent to the provision of healthcare and authorize for your insurance to be billed (if applicable) for the services provided during this visit. Depending on your insurance coverage, you may receive a charge related to this service.  I need to obtain your verbal consent now. Are you willing to proceed with your visit today? Zettie Gootee has provided verbal consent on 02/24/2024 for a virtual visit (video or telephone). Lauraine Kitty, FNP  Date: 02/24/2024 2:37 PM   Virtual Visit via Video Note   I, Lauraine Kitty, connected with  Parul Porcelli  ([id number], 1995/10/26) on 02/24/24 at  2:45 PM EDT by a video-enabled telemedicine application and verified that I am speaking with the correct person using two identifiers.  Location: Patient: Virtual Visit Location Patient:  Home Provider: Virtual Visit Location Provider: Home Office   I discussed the limitations of evaluation and management by telemedicine and the availability of in person appointments. The patient expressed understanding and agreed to proceed.    History of Present Illness: Heather Paul is a 28 y.o. who identifies as a female who was assigned female at birth, and is being seen today for increased frequency in urination and sensitivity around her urethra She has the urgency to urinate even after she finishes voiding   She has not had  UTI recently  Denies any vaginal discharge Denies fever/back pain or nausea   Denies risk for STI   Problems:  Patient Active Problem List   Diagnosis Date Noted   Normal labor 02/05/2020   Full-term premature rupture of membranes with onset of labor within 24 hours of rupture 03/15/2017    Allergies:  Allergies  Allergen Reactions   Sulfa Antibiotics Palpitations   Medications:  Current Outpatient Medications:    amoxicillin -clavulanate (AUGMENTIN ) 875-125 MG tablet, Take 1 tablet by mouth 2 (two) times daily., Disp: 14 tablet, Rfl: 0   fluconazole  (DIFLUCAN ) 150 MG tablet, Take 1 tablet PO once. Repeat in 3 days if needed., Disp: 2 tablet, Rfl: 0   fluticasone  (FLONASE ) 50 MCG/ACT nasal spray, Place 2 sprays into both nostrils daily., Disp: 16 g, Rfl: 0   fluticasone  (FLONASE ) 50 MCG/ACT nasal spray, Place 2 sprays into both nostrils daily., Disp: 16 g, Rfl: 0   guaiFENesin  200 MG tablet, Take 1 tablet (200 mg  total) by mouth every 4 (four) hours as needed for cough or to loosen phlegm., Disp: 30 tablet, Rfl: 0   naproxen  (NAPROSYN ) 500 MG tablet, Take 1 tablet (500 mg total) by mouth 2 (two) times daily with a meal., Disp: 20 tablet, Rfl: 0   phentermine 15 MG capsule, Take 15 mg by mouth daily., Disp: , Rfl:   Observations/Objective: Patient is well-developed, well-nourished in no acute distress.  Resting comfortably  at home.  Head is  normocephalic, atraumatic.  No labored breathing.  Speech is clear and coherent with logical content.  Patient is alert and oriented at baseline.    Assessment and Plan:  1. Suspected UTI (Primary) Push fluids  Avoid alcohol, sugar and caffeine  Follow up as needed   - nitrofurantoin , macrocrystal-monohydrate, (MACROBID ) 100 MG capsule; Take 1 capsule (100 mg total) by mouth 2 (two) times daily for 5 days.  Dispense: 10 capsule; Refill: 0   Follow Up Instructions: I discussed the assessment and treatment plan with the patient. The patient was provided an opportunity to ask questions and all were answered. The patient agreed with the plan and demonstrated an understanding of the instructions.  A copy of instructions were sent to the patient via MyChart unless otherwise noted below.    The patient was advised to call back or seek an in-person evaluation if the symptoms worsen or if the condition fails to improve as anticipated.    Lauraine Kitty, FNP

## 2024-05-12 ENCOUNTER — Telehealth: Admitting: Physician Assistant

## 2024-05-12 DIAGNOSIS — J329 Chronic sinusitis, unspecified: Secondary | ICD-10-CM | POA: Diagnosis not present

## 2024-05-12 DIAGNOSIS — B9789 Other viral agents as the cause of diseases classified elsewhere: Secondary | ICD-10-CM | POA: Diagnosis not present

## 2024-05-12 MED ORDER — IPRATROPIUM BROMIDE 0.03 % NA SOLN: 2.0000 | Freq: Two times a day (BID) | NASAL | 0 refills | Status: AC

## 2024-05-12 NOTE — Progress Notes (Signed)
 Virtual Visit Consent   Heather Paul, you are scheduled for a virtual visit with a [facility] provider today. Just as with appointments in the office, your consent must be obtained to participate. Your consent will be active for this visit and any virtual visit you may have with one of our providers in the next 365 days. If you have a MyChart account, a copy of this consent can be sent to you electronically.  As this is a virtual visit, video technology does not allow for your provider to perform a traditional examination. This may limit your provider's ability to fully assess your condition. If your provider identifies any concerns that need to be evaluated in person or the need to arrange testing (such as labs, EKG, etc.), we will make arrangements to do so. Although advances in technology are sophisticated, we cannot ensure that it will always work on either your end or our end. If the connection with a video visit is poor, the visit may have to be switched to a telephone visit. With either a video or telephone visit, we are not always able to ensure that we have a secure connection.  By engaging in this virtual visit, you consent to the provision of healthcare and authorize for your insurance to be billed (if applicable) for the services provided during this visit. Depending on your insurance coverage, you may receive a charge related to this service.  I need to obtain your verbal consent now. Are you willing to proceed with your visit today? Heather Paul has provided verbal consent on 05/12/2024 for a virtual visit (video or telephone). Heather Paul, [place]  Date: 05/12/2024 7:49 AM   Virtual Visit via Video Note   I, Heather Paul, connected with  Heather Paul  ([id number], 09/22/1995) on 05/12/24 at  7:45 AM EDT by a video-enabled telemedicine application and verified that I am speaking with the correct person using two identifiers.  Location: Patient: Virtual Visit Location  Patient: Home Provider: Virtual Visit Location Provider: Home Office   I discussed the limitations of evaluation and management by telemedicine and the availability of in person appointments. The patient expressed understanding and agreed to proceed.    History of Present Illness: Heather Paul is a 28 y.o. who identifies as a female who was assigned female at birth, and is being seen today for nasal congestion and sinus pressure with occasional tingling on the tip of her nose after smoking, lasting about 1-2 minutes. Denies fever, chills. Notes getting over a cold with improving nasal congestion but not fully resolved yet. Denies chest congestion or substantial cough.  OTC -- Catering manager.  HPI: HPI  Problems:  Patient Active Problem List   Diagnosis Date Noted   Normal labor 02/05/2020   Full-term premature rupture of membranes with onset of labor within 24 hours of rupture 03/15/2017    Allergies:  Allergies  Allergen Reactions   Sulfa Antibiotics Palpitations   Medications:  Current Outpatient Medications:    ipratropium (ATROVENT) 0.03 % nasal spray, Place 2 sprays into both nostrils every 12 (twelve) hours., Disp: 30 mL, Rfl: 0   fluticasone  (FLONASE ) 50 MCG/ACT nasal spray, Place 2 sprays into both nostrils daily., Disp: 16 g, Rfl: 0   guaiFENesin  200 MG tablet, Take 1 tablet (200 mg total) by mouth every 4 (four) hours as needed for cough or to loosen phlegm., Disp: 30 tablet, Rfl: 0   naproxen  (NAPROSYN ) 500 MG tablet, Take 1 tablet (500 mg total) by  mouth 2 (two) times daily with a meal., Disp: 20 tablet, Rfl: 0   phentermine 15 MG capsule, Take 15 mg by mouth daily., Disp: , Rfl:   Observations/Objective: Patient is well-developed, well-nourished in no acute distress.  Resting comfortably at home.  Head is normocephalic, atraumatic.  No labored breathing.  Speech is clear and coherent with logical content.  Patient is alert and oriented at baseline.   Assessment and  Plan: 1. Viral sinusitis (Primary) - ipratropium (ATROVENT) 0.03 % nasal spray; Place 2 sprays into both nostrils every 12 (twelve) hours.  Dispense: 30 mL; Refill: 0  Supportive measures and OTC medications reviewed. Ok to continue [employer]. Will add on Atrovent nasal spray. Counseled on risks associated with smoking. Info on [facility] given.   Follow Up Instructions: I discussed the assessment and treatment plan with the patient. The patient was provided an opportunity to ask questions and all were answered. The patient agreed with the plan and demonstrated an understanding of the instructions.  A copy of instructions were sent to the patient via MyChart unless otherwise noted below.   The patient was advised to call back or seek an in-person evaluation if the symptoms worsen or if the condition fails to improve as anticipated.    Heather Velma Lunger, PA-C

## 2024-05-12 NOTE — Patient Instructions (Signed)
 Heather Paul, thank you for joining Heather Velma Lunger, Heather Paul for today's virtual visit.  While this provider is not your primary care provider (PCP), if your PCP is located in our provider database this encounter information will be shared with them immediately following your visit.   A [facility] MyChart account gives you access to today's visit and all your visits, tests, and labs performed at [facility]  click here if you don't have a [facility] MyChart account or go to [email or web address]  Consent: (Patient) Heather Paul provided verbal consent for this virtual visit at the beginning of the encounter.  Current Medications:  Current Outpatient Medications:    amoxicillin -clavulanate (AUGMENTIN ) 875-125 MG tablet, Take 1 tablet by mouth 2 (two) times daily., Disp: 14 tablet, Rfl: 0   fluticasone  (FLONASE ) 50 MCG/ACT nasal spray, Place 2 sprays into both nostrils daily., Disp: 16 g, Rfl: 0   fluticasone  (FLONASE ) 50 MCG/ACT nasal spray, Place 2 sprays into both nostrils daily., Disp: 16 g, Rfl: 0   guaiFENesin  200 MG tablet, Take 1 tablet (200 mg total) by mouth every 4 (four) hours as needed for cough or to loosen phlegm., Disp: 30 tablet, Rfl: 0   naproxen  (NAPROSYN ) 500 MG tablet, Take 1 tablet (500 mg total) by mouth 2 (two) times daily with a meal., Disp: 20 tablet, Rfl: 0   phentermine 15 MG capsule, Take 15 mg by mouth daily., Disp: , Rfl:    Medications ordered in this encounter:  No orders of the defined types were placed in this encounter.    *If you need refills on other medications prior to your next appointment, please contact your pharmacy*  Follow-Up: Call back or seek an in-person evaluation if the symptoms worsen or if the condition fails to improve as anticipated.  [facility] [phone]  Other Instructions Viral Respiratory Infection A viral respiratory infection is an illness that affects parts of the body that are used  for breathing. These include the lungs, nose, and throat. It is caused by a germ called a virus. Some examples of this kind of infection are: A cold. The flu (influenza). A respiratory syncytial virus (RSV) infection. What are the causes? This condition is caused by a virus. It spreads from person to person. You can get the virus if: You breathe in droplets from someone who is sick. You come in contact with people who are sick. You touch mucus or other fluid from a person who is sick. What are the signs or symptoms? Symptoms of this condition include: A stuffy or runny nose. A sore throat. A cough. Shortness of breath. Trouble breathing. Yellow or green fluid in the nose. Other symptoms may include: A fever. Sweating or chills. Tiredness (fatigue). Achy muscles. A headache. How is this treated? This condition may be treated with: Medicines that treat viruses. Medicines that make it easy to breathe. Medicines that are sprayed into the nose. Acetaminophen  or NSAIDs, such as ibuprofen , to treat fever. Follow these instructions at home: Managing pain and congestion Take over-the-counter and prescription medicines only as told by your doctor. If you have a sore throat, gargle with salt water. Do this 3-4 times a day or as needed. To make salt water, dissolve -1 tsp (3-6 g) of salt in 1 cup (237 mL) of warm water. Make sure that all the salt dissolves. Use nose drops made from salt water. This helps with stuffiness (congestion). It also helps soften the skin around your nose. Take 2  tsp (10 mL) of honey at bedtime to lessen coughing at night. Do not give honey to children who are younger than 28 year old. Drink enough fluid to keep your pee (urine) pale yellow. General instructions  Rest as much as possible. Do not drink alcohol. Do not smoke or use any products that contain nicotine or tobacco. If you need help quitting, ask your doctor. Keep all follow-up visits. How is this  prevented?     Get a flu shot every year. Ask your doctor when you should get your flu shot. Do not let other people get your germs. If you are sick: Wash your hands with soap and water often. Wash your hands after you cough or sneeze. Wash hands for at least 20 seconds. If you cannot use soap and water, use hand sanitizer. Cover your mouth when you cough. Cover your nose and mouth when you sneeze. Do not share cups or eating utensils. Clean commonly used objects often. Clean commonly touched surfaces. Stay home from work or school. Avoid contact with people who are sick during cold and flu season. This is in fall and winter. Get help if: Your symptoms last for 10 days or longer. Your symptoms get worse over time. You have very bad pain in your face or forehead. Parts of your jaw or neck get very swollen. You have shortness of breath. Get help right away if: You feel pain or pressure in your chest. You have trouble breathing. You faint or feel like you will faint. You keep vomiting and it gets worse. You feel confused. These symptoms may be an emergency. Get help right away. Call your local emergency services (911 in the [place] Do not wait to see if the symptoms will go away. Do not drive yourself to the hospital. Summary A viral respiratory infection is an illness that affects parts of the body that are used for breathing. Examples of this illness include a cold, the flu, and a respiratory syncytial virus (RSV) infection. The infection can cause a runny nose, cough, sore throat, and fever. Follow what your doctor tells you about taking medicines, drinking lots of fluid, washing your hands, resting at home, and avoiding people who are sick. This information is not intended to replace advice given to you by your health care provider. Make sure you discuss any questions you have with your health care provider. Document Revised: 10/26/2020 Document Reviewed: 10/26/2020 Elsevier Patient  Education  2024 [employer]   If you have been instructed to have an in-person evaluation today at a local Urgent Care facility, please use the link below. It will take you to a list of all of our available [facility], including address, phone number and hours of operation. Please do not delay care.  [facility]  If you or a family member do not have a primary care provider, use the link below to schedule a visit and establish care. When you choose a [facility] primary care physician or advanced practice provider, you gain a long-term partner in health. Find a Primary Care Provider  Learn more about [facility]'s in-office and virtual care options:  - Get Care Now

## 2024-07-23 ENCOUNTER — Telehealth
